# Patient Record
Sex: Male | Born: 1960
Health system: Southern US, Community
[De-identification: ages and names within clinical notes are randomized; demographics above are authoritative.]

## PROBLEM LIST (undated history)

## (undated) DIAGNOSIS — K219 Gastro-esophageal reflux disease without esophagitis: Secondary | ICD-10-CM

## (undated) HISTORY — DX: Gastro-esophageal reflux disease without esophagitis: K21.9

---

## 2001-06-12 ENCOUNTER — Ambulatory Visit (HOSPITAL_COMMUNITY): Admission: RE | Admit: 2001-06-12 | Discharge: 2001-06-12 | Payer: Self-pay | Admitting: Family Medicine

## 2001-06-12 ENCOUNTER — Encounter: Payer: Self-pay | Admitting: Family Medicine

## 2003-05-30 ENCOUNTER — Encounter: Admission: RE | Admit: 2003-05-30 | Discharge: 2003-05-30 | Payer: Self-pay | Admitting: Family Medicine

## 2004-06-01 ENCOUNTER — Ambulatory Visit: Payer: Self-pay | Admitting: Family Medicine

## 2004-11-20 ENCOUNTER — Ambulatory Visit: Payer: Self-pay | Admitting: Family Medicine

## 2005-12-15 ENCOUNTER — Ambulatory Visit: Payer: Self-pay | Admitting: Family Medicine

## 2006-06-05 ENCOUNTER — Encounter: Admission: RE | Admit: 2006-06-05 | Discharge: 2006-06-05 | Payer: Self-pay | Admitting: Family Medicine

## 2006-06-13 ENCOUNTER — Encounter: Admission: RE | Admit: 2006-06-13 | Discharge: 2006-06-13 | Payer: Self-pay | Admitting: Family Medicine

## 2006-06-17 ENCOUNTER — Encounter: Admission: RE | Admit: 2006-06-17 | Discharge: 2006-06-17 | Payer: Self-pay | Admitting: Psychiatry

## 2007-01-30 ENCOUNTER — Ambulatory Visit: Payer: Self-pay | Admitting: Family Medicine

## 2007-01-30 DIAGNOSIS — Z8719 Personal history of other diseases of the digestive system: Secondary | ICD-10-CM | POA: Insufficient documentation

## 2007-02-06 ENCOUNTER — Encounter: Payer: Self-pay | Admitting: Family Medicine

## 2007-02-23 ENCOUNTER — Encounter: Payer: Self-pay | Admitting: Family Medicine

## 2007-03-31 ENCOUNTER — Encounter: Payer: Self-pay | Admitting: Family Medicine

## 2007-04-07 ENCOUNTER — Encounter: Payer: Self-pay | Admitting: Family Medicine

## 2007-05-06 DIAGNOSIS — K2270 Barrett's esophagus without dysplasia: Secondary | ICD-10-CM | POA: Insufficient documentation

## 2008-03-28 ENCOUNTER — Encounter: Payer: Self-pay | Admitting: Family Medicine

## 2009-01-25 ENCOUNTER — Emergency Department (HOSPITAL_COMMUNITY): Admission: EM | Admit: 2009-01-25 | Discharge: 2009-01-25 | Payer: Self-pay | Admitting: Emergency Medicine

## 2009-01-27 ENCOUNTER — Ambulatory Visit: Payer: Self-pay | Admitting: Psychiatry

## 2009-01-27 ENCOUNTER — Other Ambulatory Visit (HOSPITAL_COMMUNITY): Admission: RE | Admit: 2009-01-27 | Discharge: 2009-03-20 | Payer: Self-pay | Admitting: Psychiatry

## 2009-02-03 ENCOUNTER — Ambulatory Visit: Payer: Self-pay | Admitting: Family Medicine

## 2010-07-22 LAB — URINE DRUGS OF ABUSE SCREEN W ALC, ROUTINE (REF LAB)
Amphetamine Screen, Ur: NEGATIVE
Amphetamine Screen, Ur: NEGATIVE
Barbiturate Quant, Ur: NEGATIVE
Barbiturate Quant, Ur: NEGATIVE
Benzodiazepines.: NEGATIVE
Benzodiazepines.: NEGATIVE
Cocaine Metabolites: NEGATIVE
Cocaine Metabolites: NEGATIVE
Creatinine,U: 26.3 mg/dL
Creatinine,U: 14.9 mg/dL
Ethyl Alcohol: <10 mg/dL (ref <10)
Ethyl Alcohol: <10 mg/dL (ref <10)
Marijuana Metabolite: NEGATIVE
Marijuana Metabolite: NEGATIVE
Methadone: NEGATIVE
Methadone: NEGATIVE
Opiate Screen, Urine: NEGATIVE
Opiate Screen, Urine: NEGATIVE
Phencyclidine (PCP): NEGATIVE
Phencyclidine (PCP): NEGATIVE
Propoxyphene: NEGATIVE
Propoxyphene: NEGATIVE

## 2010-07-23 LAB — DIFFERENTIAL
Basophils Absolute: 0 10*3/uL (ref 0.0–0.1)
Basophils Relative: 0 % (ref 0–1)
Eosinophils Absolute: 0 10*3/uL (ref 0.0–0.7)
Eosinophils Relative: 0 % (ref 0–5)
Lymphocytes Relative: 22 % (ref 12–46)
Lymphs Abs: 2.4 10*3/uL (ref 0.7–4.0)
Monocytes Absolute: 0.9 10*3/uL (ref 0.1–1.0)
Monocytes Relative: 9 % (ref 3–12)
Neutro Abs: 7.6 10*3/uL (ref 1.7–7.7)
Neutrophils Relative %: 69 % (ref 43–77)

## 2010-07-23 LAB — URINE DRUGS OF ABUSE SCREEN W ALC, ROUTINE (REF LAB)
Amphetamine Screen, Ur: NEGATIVE
Amphetamine Screen, Ur: NEGATIVE
Amphetamine Screen, Ur: NEGATIVE
Barbiturate Quant, Ur: NEGATIVE
Barbiturate Quant, Ur: NEGATIVE
Barbiturate Quant, Ur: NEGATIVE
Benzodiazepines.: NEGATIVE
Benzodiazepines.: NEGATIVE
Benzodiazepines.: NEGATIVE
Cocaine Metabolites: NEGATIVE
Cocaine Metabolites: NEGATIVE
Cocaine Metabolites: NEGATIVE
Creatinine,U: 24.4 mg/dL
Creatinine,U: 25.7 mg/dL
Creatinine,U: 43.4 mg/dL
Ethyl Alcohol: <10 mg/dL (ref <10)
Ethyl Alcohol: <10 mg/dL (ref <10)
Ethyl Alcohol: <10 mg/dL (ref <10)
Marijuana Metabolite: NEGATIVE
Marijuana Metabolite: NEGATIVE
Marijuana Metabolite: NEGATIVE
Methadone: NEGATIVE
Methadone: NEGATIVE
Methadone: NEGATIVE
Opiate Screen, Urine: NEGATIVE
Opiate Screen, Urine: NEGATIVE
Opiate Screen, Urine: NEGATIVE
Phencyclidine (PCP): NEGATIVE
Phencyclidine (PCP): NEGATIVE
Phencyclidine (PCP): NEGATIVE
Propoxyphene: NEGATIVE
Propoxyphene: NEGATIVE
Propoxyphene: NEGATIVE

## 2010-07-23 LAB — COMPREHENSIVE METABOLIC PANEL
ALT: 135 U/L — ABNORMAL HIGH (ref 0–53)
AST: 306 U/L — ABNORMAL HIGH (ref 0–37)
Albumin: 4.7 g/dL (ref 3.5–5.2)
Alkaline Phosphatase: 75 U/L (ref 39–117)
BUN: 9 mg/dL (ref 6–23)
CO2: 22 mEq/L (ref 19–32)
Calcium: 9.6 mg/dL (ref 8.4–10.5)
Chloride: 96 mEq/L (ref 96–112)
Creatinine, Ser: 1.07 mg/dL (ref 0.4–1.5)
GFR calc Af Amer: >60 mL/min (ref >60)
GFR calc non Af Amer: >60 mL/min (ref >60)
Glucose, Bld: 163 mg/dL — ABNORMAL HIGH (ref 70–99)
Potassium: 3.6 mEq/L (ref 3.5–5.1)
Sodium: 138 mEq/L (ref 135–145)
Total Bilirubin: 2.2 mg/dL — ABNORMAL HIGH (ref 0.3–1.2)
Total Protein: 8.3 g/dL (ref 6.0–8.3)

## 2010-07-23 LAB — ETHANOL: Alcohol, Ethyl (B): 12 mg/dL — ABNORMAL HIGH (ref 0–10)

## 2010-07-23 LAB — URINE MICROSCOPIC-ADD ON

## 2010-07-23 LAB — CBC
HCT: 47.3 % (ref 39.0–52.0)
Hemoglobin: 16.3 g/dL (ref 13.0–17.0)
MCHC: 34.5 g/dL (ref 30.0–36.0)
MCV: 91.9 fL (ref 78.0–100.0)
Platelets: 232 10*3/uL (ref 150–400)
RBC: 5.15 MIL/uL (ref 4.22–5.81)
RDW: 12.5 % (ref 11.5–15.5)
WBC: 10.9 10*3/uL — ABNORMAL HIGH (ref 4.0–10.5)

## 2010-07-23 LAB — RAPID URINE DRUG SCREEN, HOSP PERFORMED
Amphetamines: NOT DETECTED
Barbiturates: NOT DETECTED
Benzodiazepines: NOT DETECTED
Cocaine: NOT DETECTED
Opiates: NOT DETECTED
Tetrahydrocannabinol: NOT DETECTED

## 2010-07-23 LAB — LIPASE, BLOOD: Lipase: 38 U/L (ref 11–59)

## 2010-07-23 LAB — URINALYSIS, ROUTINE W REFLEX MICROSCOPIC
Bilirubin Urine: NEGATIVE
Glucose, UA: >1000 mg/dL — AB
Hgb urine dipstick: NEGATIVE
Ketones, ur: 15 mg/dL — AB
Leukocytes, UA: NEGATIVE
Nitrite: NEGATIVE
Protein, ur: 30 mg/dL — AB
Specific Gravity, Urine: 1.046 — ABNORMAL HIGH (ref 1.005–1.030)
Urobilinogen, UA: 0.2 mg/dL (ref 0.0–1.0)
pH: 5 (ref 5.0–8.0)

## 2011-02-09 ENCOUNTER — Ambulatory Visit (INDEPENDENT_AMBULATORY_CARE_PROVIDER_SITE_OTHER): Payer: BC Managed Care – PPO | Admitting: *Deleted

## 2011-02-09 VITALS — Temp 98.6°F

## 2011-02-09 DIAGNOSIS — Z23 Encounter for immunization: Secondary | ICD-10-CM

## 2011-02-09 DIAGNOSIS — Z0000 Encounter for general adult medical examination without abnormal findings: Secondary | ICD-10-CM

## 2011-02-15 ENCOUNTER — Telehealth: Payer: Self-pay | Admitting: Family Medicine

## 2011-02-15 ENCOUNTER — Ambulatory Visit: Payer: BC Managed Care – PPO

## 2011-02-15 NOTE — Telephone Encounter (Signed)
Patient request a call with lab results before his 9:45am appointment on Oct.30,2012

## 2011-02-15 NOTE — Telephone Encounter (Signed)
msg left to call the office     KP 

## 2011-02-16 ENCOUNTER — Ambulatory Visit (INDEPENDENT_AMBULATORY_CARE_PROVIDER_SITE_OTHER): Payer: BC Managed Care – PPO | Admitting: *Deleted

## 2011-02-16 ENCOUNTER — Other Ambulatory Visit: Payer: Self-pay | Admitting: Family Medicine

## 2011-02-16 DIAGNOSIS — Z23 Encounter for immunization: Secondary | ICD-10-CM

## 2011-02-16 DIAGNOSIS — Z0000 Encounter for general adult medical examination without abnormal findings: Secondary | ICD-10-CM

## 2011-02-16 NOTE — Telephone Encounter (Signed)
Spoke with patient and he requested the results of his serum drug screen and his labs when he came in today or his TB skin test. Labs are not back, so I called [facility]. I was made aware that the drug screens are still pending as of right now and 3 red top tubes came in without an order so they have not  Processed the blood, I advised of the requested test and she stated she could not do 2 of the test and I advised we will take of it when patient comes in today.

## 2011-02-16 NOTE — Telephone Encounter (Signed)
Patient made aware at OV with [facility] and repeated labs    KP

## 2011-02-17 LAB — VARICELLA ZOSTER ANTIBODY, IGG: Varicella IgG: 4.05 {ISR} — ABNORMAL HIGH

## 2011-02-17 LAB — MUMPS ANTIBODY, IGG: Mumps IgG: 2.63 {ISR} — ABNORMAL HIGH

## 2011-02-17 LAB — RUBEOLA ANTIBODY IGG: Rubeola IgG: 3.91 {ISR} — ABNORMAL HIGH

## 2011-02-18 ENCOUNTER — Other Ambulatory Visit: Payer: BC Managed Care – PPO

## 2011-02-18 LAB — TB SKIN TEST
Induration: 0
TB Skin Test: NEGATIVE mm

## 2011-02-18 NOTE — Progress Notes (Signed)
Labs only

## 2011-02-20 LAB — RUBELLA SCREEN: Rubella: 132.3 IU/mL — ABNORMAL HIGH

## 2011-02-22 ENCOUNTER — Ambulatory Visit: Payer: BC Managed Care – PPO

## 2011-03-03 ENCOUNTER — Encounter: Payer: Self-pay | Admitting: Family Medicine

## 2012-02-02 ENCOUNTER — Ambulatory Visit (INDEPENDENT_AMBULATORY_CARE_PROVIDER_SITE_OTHER): Payer: BC Managed Care – PPO

## 2012-02-02 DIAGNOSIS — Z111 Encounter for screening for respiratory tuberculosis: Secondary | ICD-10-CM

## 2012-02-02 NOTE — Patient Instructions (Signed)
Patient aware to return on Friday for reading of PPD skin test

## 2012-02-04 LAB — TB SKIN TEST
Induration: 0 mm
TB Skin Test: NEGATIVE

## 2013-01-22 ENCOUNTER — Ambulatory Visit (INDEPENDENT_AMBULATORY_CARE_PROVIDER_SITE_OTHER): Payer: BC Managed Care – PPO | Admitting: *Deleted

## 2013-01-22 DIAGNOSIS — Z23 Encounter for immunization: Secondary | ICD-10-CM

## 2013-01-22 DIAGNOSIS — Z111 Encounter for screening for respiratory tuberculosis: Secondary | ICD-10-CM

## 2013-01-25 ENCOUNTER — Encounter: Payer: Self-pay | Admitting: *Deleted

## 2013-01-25 LAB — TB SKIN TEST: TB Skin Test: NEGATIVE

## 2013-04-24 ENCOUNTER — Encounter: Payer: Self-pay | Admitting: Family Medicine

## 2013-04-24 ENCOUNTER — Ambulatory Visit (INDEPENDENT_AMBULATORY_CARE_PROVIDER_SITE_OTHER): Payer: BC Managed Care – PPO | Admitting: Family Medicine

## 2013-04-24 VITALS — BP 108/70 | HR 67 | Temp 98.3°F | Wt 186.0 lb

## 2013-04-24 DIAGNOSIS — T753XXA Motion sickness, initial encounter: Secondary | ICD-10-CM

## 2013-04-24 DIAGNOSIS — N529 Male erectile dysfunction, unspecified: Secondary | ICD-10-CM | POA: Insufficient documentation

## 2013-04-24 DIAGNOSIS — J019 Acute sinusitis, unspecified: Secondary | ICD-10-CM

## 2013-04-24 MED ORDER — CEFUROXIME AXETIL 500 MG PO TABS: 500.0000 mg | ORAL_TABLET | Freq: Two times a day (BID) | ORAL | Status: AC

## 2013-04-24 MED ORDER — SCOPOLAMINE 1 MG/3DAYS TD PT72: 1.0000 | MEDICATED_PATCH | TRANSDERMAL | Status: DC

## 2013-04-24 MED ORDER — GUAIFENESIN-CODEINE 100-10 MG/5ML PO SYRP: ORAL_SOLUTION | ORAL | Status: DC

## 2013-04-24 MED ORDER — FLUTICASONE PROPIONATE 50 MCG/ACT NA SUSP: 2.0000 | Freq: Every day | NASAL | Status: DC

## 2013-04-24 MED ORDER — SILDENAFIL CITRATE 100 MG PO TABS: 50.0000 mg | ORAL_TABLET | Freq: Every day | ORAL | Status: DC | PRN

## 2013-04-24 NOTE — Progress Notes (Signed)
  Subjective:     Christopher Bell is a 53 y.o. male who presents for evaluation of sinus pain. Symptoms include: congestion, facial pain, nasal congestion and sinus pressure. Onset of symptoms was 2 weeks ago. Symptoms have been gradually worsening since that time. Past history is significant for no history of pneumonia or bronchitis. Patient is a non-smoker.  Pt is also going on a cruise and needs scopalamine patch. He is also requesting viagra.    The following portions of the patient's history were reviewed and updated as appropriate: allergies, current medications, past family history, past medical history, past social history, past surgical history and problem list.  Review of Systems Pertinent items are noted in HPI.   Objective:    BP 108/70  Pulse 67  Temp(Src) 98.3 F (36.8 C) (Oral)  Wt 186 lb (84.369 kg)  SpO2 98% General appearance: alert, cooperative, appears stated age and no distress Ears: normal TM's and external ear canals both ears Nose: green discharge, moderate congestion, turbinates red, swollen, sinus tenderness bilateral Throat: abnormal findings: mild oropharyngeal erythema and PND Neck: mild anterior cervical adenopathy, supple, symmetrical, trachea midline and thyroid not enlarged, symmetric, no tenderness/mass/nodules Lungs: clear to auscultation bilaterally Heart: S1, S2 normal    Assessment:    Acute bacterial sinusitis.    Plan:    Nasal steroids per medication orders. Antihistamines per medication orders. Ceftin per medication orders.

## 2013-04-24 NOTE — Assessment & Plan Note (Signed)
rx for patch given

## 2013-04-24 NOTE — Patient Instructions (Signed)

## 2013-04-24 NOTE — Assessment & Plan Note (Signed)
viagra 100 mg samples given to pt to try

## 2013-04-24 NOTE — Progress Notes (Signed)
Pre visit review using our clinic review tool, if applicable. No additional management support is needed unless otherwise documented below in the visit note. 

## 2013-06-12 ENCOUNTER — Telehealth: Payer: Self-pay | Admitting: *Deleted

## 2013-06-12 NOTE — Telephone Encounter (Signed)
Please advise      KP 

## 2013-06-12 NOTE — Telephone Encounter (Signed)
Patient called and wanted to see if we could prescribe something else for sildenafil (VIAGRA) 100 MG tablet since his insurance did not cover it.

## 2013-06-13 NOTE — Telephone Encounter (Signed)
Ins does not cover any--- I don't think.  He can check his formulary

## 2013-06-13 NOTE — Telephone Encounter (Signed)
Spoke with patient and he voiced understanding. He wanted a sample of the Cialis and Dr.Lowne said it was ok to leave at check in.       [place]

## 2013-07-17 ENCOUNTER — Telehealth: Payer: Self-pay | Admitting: Family Medicine

## 2013-07-17 DIAGNOSIS — K219 Gastro-esophageal reflux disease without esophagitis: Secondary | ICD-10-CM

## 2013-07-17 MED ORDER — PANTOPRAZOLE SODIUM 40 MG PO TBEC: 40.0000 mg | DELAYED_RELEASE_TABLET | Freq: Every day | ORAL | Status: DC

## 2013-07-17 NOTE — Telephone Encounter (Signed)
Refill for 90 day supply of pantoprazole sent to [facility], pt aware

## 2013-07-17 NOTE — Telephone Encounter (Signed)
Rx sent to [facility] for pantoprazole per pt request.

## 2013-07-17 NOTE — Telephone Encounter (Signed)
Patient called and stated that he needs a refill for Pantoprazole. Patient states that Dr Cristina Gong usually writes that for him but he needs an office visit and Dr Cristina Gong does not have any appointment until May. Dr Cristina Gong suggested that he calls [place] and see if Dr Etter Sjogren would refill it for him. Please advise.  Pharmacy [facility] [address]

## 2013-07-17 NOTE — Telephone Encounter (Signed)
Patient called and states that he  made a mistake with what pharmacy he would like his rx sent to. He meant [facility] say [facility]. Also he would like a call when its done. Please advise

## 2013-07-17 NOTE — Addendum Note (Signed)
Addended by: Chilton Greathouse on: 07/17/2013 01:49 PM   Modules accepted: Orders

## 2013-11-02 ENCOUNTER — Telehealth: Payer: Self-pay | Admitting: Family Medicine

## 2013-11-02 NOTE — Telephone Encounter (Signed)
Caller name:Crimson Relation to LV:[id number] Call back number:[phone] Pharmacy: [facility]   Reason for call: to request a refill for loperamide

## 2013-11-05 NOTE — Telephone Encounter (Signed)
Patient called a second time to check the status of his refill. Please advise.

## 2013-11-07 MED ORDER — DIPHENOXYLATE-ATROPINE 2.5-0.025 MG PO TABS: 1.0000 | ORAL_TABLET | Freq: Four times a day (QID) | ORAL | Status: DC | PRN

## 2013-11-07 NOTE — Telephone Encounter (Signed)
Spoke with patient, medication is Lomotil. Sent new Rx to [facility]

## 2013-11-08 MED ORDER — DIPHENOXYLATE-ATROPINE 2.5-0.025 MG PO TABS: 1.0000 | ORAL_TABLET | Freq: Four times a day (QID) | ORAL | Status: DC | PRN

## 2013-11-08 NOTE — Addendum Note (Signed)
Addended by: Reino Bellis on: 11/08/2013 12:36 PM   Modules accepted: Orders

## 2013-11-12 ENCOUNTER — Telehealth: Payer: Self-pay | Admitting: Family Medicine

## 2013-11-12 ENCOUNTER — Other Ambulatory Visit: Payer: Self-pay

## 2013-11-12 MED ORDER — LOPERAMIDE HCL 2 MG PO CAPS: 4.0000 mg | ORAL_CAPSULE | Freq: Three times a day (TID) | ORAL | Status: DC

## 2013-11-12 NOTE — Telephone Encounter (Signed)
Caller name: Kymere  Call back number: [phone] Pharmacy: [facility]  Reason for call:  [facility] diphenoxylate-atropine (LOMOTIL) 2.5-0.025 MG per.  Can we resend.

## 2013-11-12 NOTE — Telephone Encounter (Signed)
Done in another encounter.      KP

## 2013-11-12 NOTE — Telephone Encounter (Signed)
Wife came in with Rx to have refilled, she was told by Rachel Bo the Rx was sent. Note sent per chart. Rx sent as directed with 1 refill to [facility].      KP

## 2013-11-16 ENCOUNTER — Other Ambulatory Visit: Payer: Self-pay

## 2013-11-16 MED ORDER — LOPERAMIDE HCL 2 MG PO CAPS: 4.0000 mg | ORAL_CAPSULE | Freq: Three times a day (TID) | ORAL | Status: DC

## 2014-01-08 ENCOUNTER — Ambulatory Visit (INDEPENDENT_AMBULATORY_CARE_PROVIDER_SITE_OTHER): Payer: BC Managed Care – PPO

## 2014-01-08 DIAGNOSIS — IMO0001 Reserved for inherently not codable concepts without codable children: Secondary | ICD-10-CM

## 2014-01-08 DIAGNOSIS — A184 Tuberculosis of skin and subcutaneous tissue: Secondary | ICD-10-CM

## 2014-01-10 ENCOUNTER — Ambulatory Visit (INDEPENDENT_AMBULATORY_CARE_PROVIDER_SITE_OTHER): Payer: BC Managed Care – PPO

## 2014-01-10 DIAGNOSIS — Z23 Encounter for immunization: Secondary | ICD-10-CM

## 2014-01-10 LAB — TB SKIN TEST
Induration: 0 mm
TB Skin Test: NEGATIVE

## 2014-06-17 ENCOUNTER — Other Ambulatory Visit: Payer: Self-pay | Admitting: Family Medicine

## 2014-10-11 ENCOUNTER — Telehealth: Payer: Self-pay | Admitting: Family Medicine

## 2014-10-11 ENCOUNTER — Ambulatory Visit: Payer: Self-pay | Admitting: Medical

## 2014-10-11 MED ORDER — LOPERAMIDE HCL 2 MG PO CAPS: 4.0000 mg | ORAL_CAPSULE | Freq: Three times a day (TID) | ORAL | Status: DC

## 2014-10-11 MED ORDER — PANTOPRAZOLE SODIUM 40 MG PO TBEC: 40.0000 mg | DELAYED_RELEASE_TABLET | Freq: Every day | ORAL | Status: DC

## 2014-10-11 NOTE — Telephone Encounter (Signed)
Rx faxed.    KP 

## 2014-10-11 NOTE — Telephone Encounter (Signed)
Relation to pt: self  Call back number: [phone] Pharmacy:[facility]   Reason for call:   pt requesting a 90 day supply due to loperamide (IMODIUM) 2 MG capsule and pantoprazole (PROTONIX) 40 MG tablet. Pt scheduled appointment for  11/01/2014.

## 2014-10-18 MED ORDER — LOPERAMIDE HCL 2 MG PO CAPS: 4.0000 mg | ORAL_CAPSULE | Freq: Three times a day (TID) | ORAL | Status: DC

## 2014-10-18 NOTE — Addendum Note (Signed)
Addended by: Ewing Schlein on: 10/18/2014 02:34 PM   Modules accepted: Orders

## 2014-10-18 NOTE — Telephone Encounter (Signed)
Patient states that the incorrect amt of loperamide was [facility]. Requesting a callback from [place] before this is resubmitted.

## 2014-10-18 NOTE — Telephone Encounter (Addendum)
Spoke with the patient and his quantity should be 540. I advised I would update his chart.     KP

## 2014-10-18 NOTE — Telephone Encounter (Signed)
MSG left to call the office      KP 

## 2014-11-01 ENCOUNTER — Ambulatory Visit: Payer: Self-pay | Admitting: Family Medicine

## 2014-11-01 ENCOUNTER — Encounter: Payer: Self-pay | Admitting: Behavioral Health

## 2014-11-01 ENCOUNTER — Telehealth: Payer: Self-pay | Admitting: Behavioral Health

## 2014-11-01 NOTE — Telephone Encounter (Signed)
Pre-Visit Call completed with patient and chart updated.   Pre-Visit Info documented in Specialty Comments under [employer].    

## 2014-11-04 ENCOUNTER — Ambulatory Visit (INDEPENDENT_AMBULATORY_CARE_PROVIDER_SITE_OTHER): Payer: BLUE CROSS/BLUE SHIELD | Admitting: Family Medicine

## 2014-11-04 ENCOUNTER — Encounter: Payer: Self-pay | Admitting: Family Medicine

## 2014-11-04 VITALS — BP 108/64 | HR 56 | Temp 98.0°F | Ht 70.0 in | Wt 178.0 lb

## 2014-11-04 DIAGNOSIS — N401 Enlarged prostate with lower urinary tract symptoms: Secondary | ICD-10-CM

## 2014-11-04 DIAGNOSIS — D229 Melanocytic nevi, unspecified: Secondary | ICD-10-CM | POA: Diagnosis not present

## 2014-11-04 DIAGNOSIS — K2270 Barrett's esophagus without dysplasia: Secondary | ICD-10-CM

## 2014-11-04 DIAGNOSIS — Z0000 Encounter for general adult medical examination without abnormal findings: Secondary | ICD-10-CM | POA: Diagnosis not present

## 2014-11-04 DIAGNOSIS — R351 Nocturia: Secondary | ICD-10-CM

## 2014-11-04 DIAGNOSIS — R7989 Other specified abnormal findings of blood chemistry: Secondary | ICD-10-CM | POA: Diagnosis not present

## 2014-11-04 DIAGNOSIS — N528 Other male erectile dysfunction: Secondary | ICD-10-CM

## 2014-11-04 DIAGNOSIS — K219 Gastro-esophageal reflux disease without esophagitis: Secondary | ICD-10-CM

## 2014-11-04 LAB — LIPID PANEL
Cholesterol: 226 mg/dL — ABNORMAL HIGH (ref 0–200)
HDL: 47.4 mg/dL (ref >39.00)
NonHDL: 178.6
Total CHOL/HDL Ratio: 5
Triglycerides: 215 mg/dL — ABNORMAL HIGH (ref 0.0–149.0)
VLDL: 43 mg/dL — ABNORMAL HIGH (ref 0.0–40.0)

## 2014-11-04 LAB — CBC WITH DIFFERENTIAL/PLATELET
Basophils Absolute: 0 10*3/uL (ref 0.0–0.1)
Basophils Relative: 0.5 % (ref 0.0–3.0)
Eosinophils Absolute: 0.1 10*3/uL (ref 0.0–0.7)
Eosinophils Relative: 1.4 % (ref 0.0–5.0)
HCT: 41 % (ref 39.0–52.0)
Hemoglobin: 13.9 g/dL (ref 13.0–17.0)
Lymphocytes Relative: 28.1 % (ref 12.0–46.0)
Lymphs Abs: 1.7 10*3/uL (ref 0.7–4.0)
MCHC: 33.8 g/dL (ref 30.0–36.0)
MCV: 88.3 fl (ref 78.0–100.0)
Monocytes Absolute: 0.4 10*3/uL (ref 0.1–1.0)
Monocytes Relative: 6 % (ref 3.0–12.0)
Neutro Abs: 3.8 10*3/uL (ref 1.4–7.7)
Neutrophils Relative %: 64 % (ref 43.0–77.0)
Platelets: 264 10*3/uL (ref 150.0–400.0)
RBC: 4.64 Mil/uL (ref 4.22–5.81)
RDW: 12.4 % (ref 11.5–15.5)
WBC: 6 10*3/uL (ref 4.0–10.5)

## 2014-11-04 LAB — BASIC METABOLIC PANEL
BUN: 11 mg/dL (ref 6–23)
CO2: 30 mEq/L (ref 19–32)
Calcium: 9.3 mg/dL (ref 8.4–10.5)
Chloride: 101 mEq/L (ref 96–112)
Creatinine, Ser: 0.92 mg/dL (ref 0.40–1.50)
GFR: 91.37 mL/min (ref >60.00)
Glucose, Bld: 88 mg/dL (ref 70–99)
Potassium: 4.2 mEq/L (ref 3.5–5.1)
Sodium: 140 mEq/L (ref 135–145)

## 2014-11-04 LAB — HEPATIC FUNCTION PANEL
ALT: 13 U/L (ref 0–53)
AST: 18 U/L (ref 0–37)
Albumin: 4.5 g/dL (ref 3.5–5.2)
Alkaline Phosphatase: 60 U/L (ref 39–117)
Bilirubin, Direct: 0.1 mg/dL (ref 0.0–0.3)
Total Bilirubin: 0.5 mg/dL (ref 0.2–1.2)
Total Protein: 7.3 g/dL (ref 6.0–8.3)

## 2014-11-04 LAB — LDL CHOLESTEROL, DIRECT: Direct LDL: 145 mg/dL

## 2014-11-04 LAB — POCT URINALYSIS DIPSTICK
Bilirubin, UA: NEGATIVE
Blood, UA: NEGATIVE
Glucose, UA: NEGATIVE
Ketones, UA: NEGATIVE
Leukocytes, UA: NEGATIVE
Nitrite, UA: NEGATIVE
Protein, UA: NEGATIVE
Spec Grav, UA: 1.025
Urobilinogen, UA: 0.2
pH, UA: 6

## 2014-11-04 LAB — HIV ANTIBODY (ROUTINE TESTING W REFLEX): HIV 1&2 Ab, 4th Generation: NONREACTIVE

## 2014-11-04 LAB — PSA: PSA: 0.54 ng/mL (ref 0.10–4.00)

## 2014-11-04 LAB — TSH: TSH: 1.51 u[IU]/mL (ref 0.35–4.50)

## 2014-11-04 MED ORDER — TAMSULOSIN HCL 0.4 MG PO CAPS: 0.4000 mg | ORAL_CAPSULE | Freq: Every day | ORAL | Status: DC

## 2014-11-04 MED ORDER — SILDENAFIL CITRATE 25 MG PO TABS
25.0000 mg | ORAL_TABLET | Freq: Every day | ORAL | Status: DC | PRN
Start: 2014-11-04 — End: 2014-11-11

## 2014-11-04 MED ORDER — NIZATIDINE 75 MG PO TABS: 75.0000 mg | ORAL_TABLET | Freq: Every day | ORAL | Status: DC

## 2014-11-04 NOTE — Progress Notes (Signed)
Pre visit review using our clinic review tool, if applicable. No additional management support is needed unless otherwise documented below in the visit note. 

## 2014-11-04 NOTE — Patient Instructions (Signed)
Preventive Care for Adults A healthy lifestyle and preventive care can promote health and wellness. Preventive health guidelines for men include the following key practices:  A routine yearly physical is a good way to check with your health care provider about your health and preventative screening. It is a chance to share any concerns and updates on your health and to receive a thorough exam.  Visit your dentist for a routine exam and preventative care every 6 months. Brush your teeth twice a day and floss once a day. Good oral hygiene prevents tooth decay and gum disease.  The frequency of eye exams is based on your age, health, family medical history, use of contact lenses, and other factors. Follow your health care provider's recommendations for frequency of eye exams.  Eat a healthy diet. Foods such as vegetables, fruits, whole grains, low-fat dairy products, and lean protein foods contain the nutrients you need without too many calories. Decrease your intake of foods high in solid fats, added sugars, and salt. Eat the right amount of calories for you.Get information about a proper diet from your health care provider, if necessary.  Regular physical exercise is one of the most important things you can do for your health. Most adults should get at least 150 minutes of moderate-intensity exercise (any activity that increases your heart rate and causes you to sweat) each week. In addition, most adults need muscle-strengthening exercises on 2 or more days a week.  Maintain a healthy weight. The body mass index (BMI) is a screening tool to identify possible weight problems. It provides an estimate of body fat based on height and weight. Your health care provider can find your BMI and can help you achieve or maintain a healthy weight.For adults 20 years and older:  A BMI below 18.5 is considered underweight.  A BMI of 18.5 to 24.9 is normal.  A BMI of 25 to 29.9 is considered overweight.  A BMI  of 30 and above is considered obese.  Maintain normal blood lipids and cholesterol levels by exercising and minimizing your intake of saturated fat. Eat a balanced diet with plenty of fruit and vegetables. Blood tests for lipids and cholesterol should begin at age 50 and be repeated every 5 years. If your lipid or cholesterol levels are high, you are over 50, or you are at high risk for heart disease, you may need your cholesterol levels checked more frequently.Ongoing high lipid and cholesterol levels should be treated with medicines if diet and exercise are not working.  If you smoke, find out from your health care provider how to quit. If you do not use tobacco, do not start.  Lung cancer screening is recommended for adults aged 73-80 years who are at high risk for developing lung cancer because of a history of smoking. A yearly low-dose CT scan of the lungs is recommended for people who have at least a 30-pack-year history of smoking and are a current smoker or have quit within the past 15 years. A pack year of smoking is smoking an average of 1 pack of cigarettes a day for 1 year (for example: 1 pack a day for 30 years or 2 packs a day for 15 years). Yearly screening should continue until the smoker has stopped smoking for at least 15 years. Yearly screening should be stopped for people who develop a health problem that would prevent them from having lung cancer treatment.  If you choose to drink alcohol, do not have more than  2 drinks per day. One drink is considered to be 12 ounces (355 mL) of beer, 5 ounces (148 mL) of wine, or 1.5 ounces (44 mL) of liquor.  Avoid use of street drugs. Do not share needles with anyone. Ask for help if you need support or instructions about stopping the use of drugs.  High blood pressure causes heart disease and increases the risk of stroke. Your blood pressure should be checked at least every 1-2 years. Ongoing high blood pressure should be treated with  medicines, if weight loss and exercise are not effective.  If you are 37-19 years old, ask your health care provider if you should take aspirin to prevent heart disease.  Diabetes screening involves taking a blood sample to check your fasting blood sugar level. This should be done once every 3 years, after age 68, if you are within normal weight and without risk factors for diabetes. Testing should be considered at a younger age or be carried out more frequently if you are overweight and have at least 1 risk factor for diabetes.  Colorectal cancer can be detected and often prevented. Most routine colorectal cancer screening begins at the age of 68 and continues through age 8. However, your health care provider may recommend screening at an earlier age if you have risk factors for colon cancer. On a yearly basis, your health care provider may provide home test kits to check for hidden blood in the stool. Use of a small camera at the end of a tube to directly examine the colon (sigmoidoscopy or colonoscopy) can detect the earliest forms of colorectal cancer. Talk to your health care provider about this at age 42, when routine screening begins. Direct exam of the colon should be repeated every 5-10 years through age 27, unless early forms of precancerous polyps or small growths are found.  People who are at an increased risk for hepatitis B should be screened for this virus. You are considered at high risk for hepatitis B if:  You were born in a country where hepatitis B occurs often. Talk with your health care provider about which countries are considered high risk.  Your parents were born in a high-risk country and you have not received a shot to protect against hepatitis B (hepatitis B vaccine).  You have HIV or AIDS.  You use needles to inject street drugs.  You live with, or have sex with, someone who has hepatitis B.  You are a man who has sex with other men (MSM).  You get hemodialysis  treatment.  You take certain medicines for conditions such as cancer, organ transplantation, and autoimmune conditions.  Hepatitis C blood testing is recommended for all people born from [birth year] through 1965 and any individual with known risks for hepatitis C.  Practice safe sex. Use condoms and avoid high-risk sexual practices to reduce the spread of sexually transmitted infections (STIs). STIs include gonorrhea, chlamydia, syphilis, trichomonas, herpes, HPV, and human immunodeficiency virus (HIV). Herpes, HIV, and HPV are viral illnesses that have no cure. They can result in disability, cancer, and death.  If you are at risk of being infected with HIV, it is recommended that you take a prescription medicine daily to prevent HIV infection. This is called preexposure prophylaxis (PrEP). You are considered at risk if:  You are a man who has sex with other men (MSM) and have other risk factors.  You are a heterosexual man, are sexually active, and are at increased risk for HIV infection.  You take drugs by injection.  You are sexually active with a partner who has HIV.  Talk with your health care provider about whether you are at high risk of being infected with HIV. If you choose to begin PrEP, you should first be tested for HIV. You should then be tested every 3 months for as long as you are taking PrEP.  A one-time screening for abdominal aortic aneurysm (AAA) and surgical repair of large AAAs by ultrasound are recommended for men ages 32 to 67 years who are current or former smokers.  Healthy men should no longer receive prostate-specific antigen (PSA) blood tests as part of routine cancer screening. Talk with your health care provider about prostate cancer screening.  Testicular cancer screening is not recommended for adult males who have no symptoms. Screening includes self-exam, a health care provider exam, and other screening tests. Consult with your health care provider about any symptoms  you have or any concerns you have about testicular cancer.  Use sunscreen. Apply sunscreen liberally and repeatedly throughout the day. You should seek shade when your shadow is shorter than you. Protect yourself by wearing long sleeves, pants, a wide-brimmed hat, and sunglasses year round, whenever you are outdoors.  Once a month, do a whole-body skin exam, using a mirror to look at the skin on your back. Tell your health care provider about new moles, moles that have irregular borders, moles that are larger than a pencil eraser, or moles that have changed in shape or color.  Stay current with required vaccines (immunizations).  Influenza vaccine. All adults should be immunized every year.  Tetanus, diphtheria, and acellular pertussis (Td, Tdap) vaccine. An adult who has not previously received Tdap or who does not know his vaccine status should receive 1 dose of Tdap. This initial dose should be followed by tetanus and diphtheria toxoids (Td) booster doses every 10 years. Adults with an unknown or incomplete history of completing a 3-dose immunization series with Td-containing vaccines should begin or complete a primary immunization series including a Tdap dose. Adults should receive a Td booster every 10 years.  Varicella vaccine. An adult without evidence of immunity to varicella should receive 2 doses or a second dose if he has previously received 1 dose.  Human papillomavirus (HPV) vaccine. Males aged 68-21 years who have not received the vaccine previously should receive the 3-dose series. Males aged 22-26 years may be immunized. Immunization is recommended through the age of 6 years for any male who has sex with males and did not get any or all doses earlier. Immunization is recommended for any person with an immunocompromised condition through the age of 49 years if he did not get any or all doses earlier. During the 3-dose series, the second dose should be obtained 4-8 weeks after the first  dose. The third dose should be obtained 24 weeks after the first dose and 16 weeks after the second dose.  Zoster vaccine. One dose is recommended for adults aged 50 years or older unless certain conditions are present.  Measles, mumps, and rubella (MMR) vaccine. Adults born before [birth year] generally are considered immune to measles and mumps. Adults born in [birth year] or later should have 1 or more doses of MMR vaccine unless there is a contraindication to the vaccine or there is laboratory evidence of immunity to each of the three diseases. A routine second dose of MMR vaccine should be obtained at least 28 days after the first dose for students attending postsecondary  schools, health care workers, or international travelers. People who received inactivated measles vaccine or an unknown type of measles vaccine during 1963-1967 should receive 2 doses of MMR vaccine. People who received inactivated mumps vaccine or an unknown type of mumps vaccine before 1979 and are at high risk for mumps infection should consider immunization with 2 doses of MMR vaccine. Unvaccinated health care workers born before 1957 who lack laboratory evidence of measles, mumps, or rubella immunity or laboratory confirmation of disease should consider measles and mumps immunization with 2 doses of MMR vaccine or rubella immunization with 1 dose of MMR vaccine.  Pneumococcal 13-valent conjugate (PCV13) vaccine. When indicated, a person who is uncertain of his immunization history and has no record of immunization should receive the PCV13 vaccine. An adult aged 19 years or older who has certain medical conditions and has not been previously immunized should receive 1 dose of PCV13 vaccine. This PCV13 should be followed with a dose of pneumococcal polysaccharide (PPSV23) vaccine. The PPSV23 vaccine dose should be obtained at least 8 weeks after the dose of PCV13 vaccine. An adult aged 19 years or older who has certain medical conditions and  previously received 1 or more doses of PPSV23 vaccine should receive 1 dose of PCV13. The PCV13 vaccine dose should be obtained 1 or more years after the last PPSV23 vaccine dose.  Pneumococcal polysaccharide (PPSV23) vaccine. When PCV13 is also indicated, PCV13 should be obtained first. All adults aged 65 years and older should be immunized. An adult younger than age 65 years who has certain medical conditions should be immunized. Any person who resides in a nursing home or long-term care facility should be immunized. An adult smoker should be immunized. People with an immunocompromised condition and certain other conditions should receive both PCV13 and PPSV23 vaccines. People with human immunodeficiency virus (HIV) infection should be immunized as soon as possible after diagnosis. Immunization during chemotherapy or radiation therapy should be avoided. Routine use of PPSV23 vaccine is not recommended for American Indians, [place] Natives, or people younger than 65 years unless there are medical conditions that require PPSV23 vaccine. When indicated, people who have unknown immunization and have no record of immunization should receive PPSV23 vaccine. One-time revaccination 5 years after the first dose of PPSV23 is recommended for people aged 19-64 years who have chronic kidney failure, nephrotic syndrome, asplenia, or immunocompromised conditions. People who received 1-2 doses of PPSV23 before age 65 years should receive another dose of PPSV23 vaccine at age 65 years or later if at least 5 years have passed since the previous dose. Doses of PPSV23 are not needed for people immunized with PPSV23 at or after age 65 years.  Meningococcal vaccine. Adults with asplenia or persistent complement component deficiencies should receive 2 doses of quadrivalent meningococcal conjugate (MenACWY-D) vaccine. The doses should be obtained at least 2 months apart. Microbiologists working with certain meningococcal bacteria,  military recruits, people at risk during an outbreak, and people who travel to or live in countries with a high rate of meningitis should be immunized. A first-year college student up through age 21 years who is living in a residence hall should receive a dose if he did not receive a dose on or after his 16th birthday. Adults who have certain high-risk conditions should receive one or more doses of vaccine.  Hepatitis A vaccine. Adults who wish to be protected from this disease, have certain high-risk conditions, work with hepatitis A-infected animals, work in hepatitis A research labs, or   travel to or work in countries with a high rate of hepatitis A should be immunized. Adults who were previously unvaccinated and who anticipate close contact with an international adoptee during the first 60 days after arrival in the [place] from a country with a high rate of hepatitis A should be immunized.  Hepatitis B vaccine. Adults should be immunized if they wish to be protected from this disease, have certain high-risk conditions, may be exposed to blood or other infectious body fluids, are household contacts or sex partners of hepatitis B positive people, are clients or workers in certain care facilities, or travel to or work in countries with a high rate of hepatitis B.  Haemophilus influenzae type b (Hib) vaccine. A previously unvaccinated person with asplenia or sickle cell disease or having a scheduled splenectomy should receive 1 dose of Hib vaccine. Regardless of previous immunization, a recipient of a hematopoietic stem cell transplant should receive a 3-dose series 6-12 months after his successful transplant. Hib vaccine is not recommended for adults with HIV infection. Preventive Service / Frequency Ages 52 to 17  Blood pressure check.** / Every 1 to 2 years.  Lipid and cholesterol check.** / Every 5 years beginning at age 69.  Hepatitis C blood test.** / For any individual with known risks for  hepatitis C.  Skin self-exam. / Monthly.  Influenza vaccine. / Every year.  Tetanus, diphtheria, and acellular pertussis (Tdap, Td) vaccine.** / Consult your health care provider. 1 dose of Td every 10 years.  Varicella vaccine.** / Consult your health care provider.  HPV vaccine. / 3 doses over 6 months, if 72 or younger.  Measles, mumps, rubella (MMR) vaccine.** / You need at least 1 dose of MMR if you were born in 1957 or later. You may also need a second dose.  Pneumococcal 13-valent conjugate (PCV13) vaccine.** / Consult your health care provider.  Pneumococcal polysaccharide (PPSV23) vaccine.** / 1 to 2 doses if you smoke cigarettes or if you have certain conditions.  Meningococcal vaccine.** / 1 dose if you are age 35 to 60 years and a Market researcher living in a residence hall, or have one of several medical conditions. You may also need additional booster doses.  Hepatitis A vaccine.** / Consult your health care provider.  Hepatitis B vaccine.** / Consult your health care provider.  Haemophilus influenzae type b (Hib) vaccine.** / Consult your health care provider. Ages 35 to 8  Blood pressure check.** / Every 1 to 2 years.  Lipid and cholesterol check.** / Every 5 years beginning at age 57.  Lung cancer screening. / Every year if you are aged 44-80 years and have a 30-pack-year history of smoking and currently smoke or have quit within the past 15 years. Yearly screening is stopped once you have quit smoking for at least 15 years or develop a health problem that would prevent you from having lung cancer treatment.  Fecal occult blood test (FOBT) of stool. / Every year beginning at age 55 and continuing until age 73. You may not have to do this test if you get a colonoscopy every 10 years.  Flexible sigmoidoscopy** or colonoscopy.** / Every 5 years for a flexible sigmoidoscopy or every 10 years for a colonoscopy beginning at age 28 and continuing until age  1.  Hepatitis C blood test.** / For all people born from [birth year] through 1965 and any individual with known risks for hepatitis C.  Skin self-exam. / Monthly.  Influenza vaccine. / Every  year.  Tetanus, diphtheria, and acellular pertussis (Tdap/Td) vaccine.** / Consult your health care provider. 1 dose of Td every 10 years.  Varicella vaccine.** / Consult your health care provider.  Zoster vaccine.** / 1 dose for adults aged 53 years or older.  Measles, mumps, rubella (MMR) vaccine.** / You need at least 1 dose of MMR if you were born in 1957 or later. You may also need a second dose.  Pneumococcal 13-valent conjugate (PCV13) vaccine.** / Consult your health care provider.  Pneumococcal polysaccharide (PPSV23) vaccine.** / 1 to 2 doses if you smoke cigarettes or if you have certain conditions.  Meningococcal vaccine.** / Consult your health care provider.  Hepatitis A vaccine.** / Consult your health care provider.  Hepatitis B vaccine.** / Consult your health care provider.  Haemophilus influenzae type b (Hib) vaccine.** / Consult your health care provider. Ages 77 and over  Blood pressure check.** / Every 1 to 2 years.  Lipid and cholesterol check.**/ Every 5 years beginning at age 85.  Lung cancer screening. / Every year if you are aged 55-80 years and have a 30-pack-year history of smoking and currently smoke or have quit within the past 15 years. Yearly screening is stopped once you have quit smoking for at least 15 years or develop a health problem that would prevent you from having lung cancer treatment.  Fecal occult blood test (FOBT) of stool. / Every year beginning at age 33 and continuing until age 11. You may not have to do this test if you get a colonoscopy every 10 years.  Flexible sigmoidoscopy** or colonoscopy.** / Every 5 years for a flexible sigmoidoscopy or every 10 years for a colonoscopy beginning at age 28 and continuing until age 73.  Hepatitis C blood  test.** / For all people born from [birth year] through 1964 and any individual with known risks for hepatitis C.  Abdominal aortic aneurysm (AAA) screening.** / A one-time screening for ages 50 to 27 years who are current or former smokers.  Skin self-exam. / Monthly.  Influenza vaccine. / Every year.  Tetanus, diphtheria, and acellular pertussis (Tdap/Td) vaccine.** / 1 dose of Td every 10 years.  Varicella vaccine.** / Consult your health care provider.  Zoster vaccine.** / 1 dose for adults aged 34 years or older.  Pneumococcal 13-valent conjugate (PCV13) vaccine.** / Consult your health care provider.  Pneumococcal polysaccharide (PPSV23) vaccine.** / 1 dose for all adults aged 63 years and older.  Meningococcal vaccine.** / Consult your health care provider.  Hepatitis A vaccine.** / Consult your health care provider.  Hepatitis B vaccine.** / Consult your health care provider.  Haemophilus influenzae type b (Hib) vaccine.** / Consult your health care provider. **Family history and personal history of risk and conditions may change your health care provider's recommendations. Document Released: 06/01/2001 Document Revised: 04/10/2013 Document Reviewed: 08/31/2010 [facility] Patient Information 2015 [place], [place]. This information is not intended to replace advice given to you by your health care provider. Make sure you discuss any questions you have with your health care provider.

## 2014-11-04 NOTE — Assessment & Plan Note (Signed)
con't axid per Dr Jackquline Denmark

## 2014-11-04 NOTE — Assessment & Plan Note (Signed)
Pt requesting generic viagra

## 2014-11-04 NOTE — Progress Notes (Signed)
Patient ID: Christopher Bell, male    DOB: [birth year]  Age: 54 y.o. MRN: [id number]    Subjective:  Subjective HPI Christopher Bell presents for cpe and c/o weak urine stream x 2 years -- he also c/o strangel growth on L side of face x several months.  Not discolored.  No pain, no itching.  No hx insect bite.   He would like to see about having them removed.  Review of Systems  Constitutional: Negative for fatigue and unexpected weight change.  Respiratory: Negative for cough and shortness of breath.   Cardiovascular: Negative for chest pain and palpitations.  Neurological: Negative for dizziness, tremors, seizures, syncope, speech difficulty, weakness, light-headedness, numbness and headaches.  Hematological: Negative for adenopathy. Does not bruise/bleed easily.  Psychiatric/Behavioral: Negative for decreased concentration. The patient is not nervous/anxious.     History Past Medical History  Diagnosis Date  . GERD (gastroesophageal reflux disease)     He has no past surgical history on file.   His family history is not on file.He reports that he has never smoked. He has never used smokeless tobacco. He reports that he does not drink alcohol or use illicit drugs.  Current Outpatient Prescriptions on File Prior to Visit  Medication Sig Dispense Refill  . loperamide (IMODIUM) 2 MG capsule Take 2 capsules (4 mg total) by mouth 3 (three) times daily. 540 capsule 0   No current facility-administered medications on file prior to visit.     Objective:  Objective Physical Exam  Constitutional: He is oriented to person, place, and time. He appears well-developed and well-nourished. No distress.  HENT:  Head: Normocephalic and atraumatic.  Right Ear: External ear normal.  Left Ear: External ear normal.  Nose: Nose normal.  Mouth/Throat: Oropharynx is clear and moist. No oropharyngeal exudate.  Eyes: Conjunctivae and EOM are normal. Pupils are equal, round, and reactive to light.  Right eye exhibits no discharge. Left eye exhibits no discharge.  Neck: Normal range of motion. Neck supple. No JVD present. No thyromegaly present.  Cardiovascular: Normal rate, regular rhythm and intact distal pulses.  Exam reveals no gallop and no friction rub.   No murmur heard. Pulmonary/Chest: Effort normal and breath sounds normal. No respiratory distress. He has no wheezes. He has no rales. He exhibits no tenderness.  Abdominal: Soft. Bowel sounds are normal. He exhibits no distension and no mass. There is no tenderness. There is no rebound and no guarding.  Genitourinary: Rectum normal and penis normal. Guaiac negative stool. Prostate is enlarged. Prostate is not tender.  Musculoskeletal: Normal range of motion. He exhibits no edema or tenderness.  Lymphadenopathy:    He has no cervical adenopathy.  Neurological: He is alert and oriented to person, place, and time. He displays normal reflexes. He exhibits normal muscle tone.  Skin: Skin is warm and dry. Rash noted. Rash is papular. He is not diaphoretic. No erythema. No pallor.     Psychiatric: He has a normal mood and affect. His behavior is normal. Judgment and thought content normal.   BP 108/64 mmHg  Pulse 56  Temp(Src) 98 F (36.7 C) (Oral)  Ht 5\' 10"  (1.778 m)  Wt 178 lb (80.74 kg)  BMI 25.54 kg/m2  SpO2 97% Wt Readings from Last 3 Encounters:  11/04/14 178 lb (80.74 kg)  04/24/13 186 lb (84.369 kg)  01/30/07 179 lb (81.194 kg)     Lab Results  Component Value Date   WBC 10.9* 01/25/2009   HGB 16.3 01/25/2009  HCT 47.3 01/25/2009   PLT 232 01/25/2009   GLUCOSE 163* 01/25/2009   ALT 135* 01/25/2009   AST 306* 01/25/2009   NA 138 01/25/2009   K 3.6 01/25/2009   CL 96 01/25/2009   CREATININE 1.07 01/25/2009   BUN 9 01/25/2009   CO2 22 01/25/2009    No results found.   Assessment & Plan:  Plan I have discontinued Christopher Bell scopolamine, fluticasone, guaiFENesin-codeine, sildenafil,  diphenoxylate-atropine, and pantoprazole. I have also changed his nizatidine. Additionally, I am having him start on sildenafil. Lastly, I am having him maintain his loperamide and tamsulosin.  Meds ordered this encounter  Medications  . nizatidine (AXID) 75 MG tablet    Sig: Take 1 tablet (75 mg total) by mouth daily.    Dispense:  90 tablet    Refill:  3  . sildenafil (VIAGRA) 25 MG tablet    Sig: Take 1 tablet (25 mg total) by mouth daily as needed for erectile dysfunction.    Dispense:  30 tablet    Refill:  0  . DISCONTD: tamsulosin (FLOMAX) 0.4 MG CAPS capsule    Sig: Take 1 capsule (0.4 mg total) by mouth daily.    Dispense:  30 capsule    Refill:  3  . tamsulosin (FLOMAX) 0.4 MG CAPS capsule    Sig: Take 1 capsule (0.4 mg total) by mouth daily.    Dispense:  90 capsule    Refill:  3    Problem List Items Addressed This Visit    Preventative health care - Primary    ghm utd Check labs Pt will have colonoscopy with Christopher Christopher Bell  See AVS      Relevant Orders   Basic metabolic panel   CBC with Differential/Platelet   Hepatic function panel   Lipid panel   POCT urinalysis dipstick (Completed)   PSA   TSH   HIV antibody   Numerous moles   Relevant Orders   Ambulatory referral to Dermatology   Erectile dysfunction    Pt requesting generic viagra      Relevant Medications   sildenafil (VIAGRA) 25 MG tablet   BARRETTS ESOPHAGUS    con't axid per Christopher Bell       Other Visit Diagnoses    Gastroesophageal reflux disease, esophagitis presence not specified        Relevant Medications    nizatidine (AXID) 75 MG tablet    BPH associated with nocturia        Relevant Medications    tamsulosin (FLOMAX) 0.4 MG CAPS capsule       Follow-up: Return in about 1 year (around 11/04/2015), or if symptoms worsen or fail to improve.  Christopher Koyanagi, DO

## 2014-11-04 NOTE — Assessment & Plan Note (Signed)
ghm utd Check labs Pt will have colonoscopy with Dr Harlene Ramus  See AVS

## 2014-11-11 ENCOUNTER — Telehealth: Payer: Self-pay | Admitting: Family Medicine

## 2014-11-11 DIAGNOSIS — K219 Gastro-esophageal reflux disease without esophagitis: Secondary | ICD-10-CM

## 2014-11-11 MED ORDER — NIZATIDINE 75 MG PO TABS: 75.0000 mg | ORAL_TABLET | Freq: Every day | ORAL | Status: DC

## 2014-11-11 MED ORDER — SILDENAFIL CITRATE 25 MG PO TABS: 25.0000 mg | ORAL_TABLET | Freq: Every day | ORAL | Status: DC | PRN

## 2014-11-11 NOTE — Telephone Encounter (Signed)
Relation to pt: self  Call back number: [phone]   Reason for call:  Patient would like to discuss medication prescribed at last OV, patient does not know the name of medication, but states its the wrong medication.

## 2014-11-11 NOTE — Telephone Encounter (Signed)
Spoke with patient and he wanted to get the Sidenafil sent instead of Viagra. OK per Dr.Lowne..  Rx faxed.     KP

## 2014-11-13 ENCOUNTER — Telehealth: Payer: Self-pay | Admitting: Family Medicine

## 2014-11-13 MED ORDER — SILDENAFIL CITRATE 25 MG PO TABS: 25.0000 mg | ORAL_TABLET | Freq: Every day | ORAL | Status: DC | PRN

## 2014-11-13 NOTE — Telephone Encounter (Signed)
[facility]. Notified pt.

## 2014-11-13 NOTE — Telephone Encounter (Signed)
Pt states that RX for generic viagra 3 months supply went to mail order [facility]. They do not cover it thru the mail order and pt states it would be $200 (name brand I believe). He asked that we send generic in to [facility] [address].

## 2015-01-23 ENCOUNTER — Telehealth: Payer: Self-pay | Admitting: Family Medicine

## 2015-01-23 DIAGNOSIS — Z111 Encounter for screening for respiratory tuberculosis: Secondary | ICD-10-CM

## 2015-01-23 NOTE — Telephone Encounter (Signed)
Pt called and scheduled lab appt for ppd 01/27/15 10:15am. Also scheduled flu shot with nurse 01/27/15 10:30am.

## 2015-01-23 NOTE — Telephone Encounter (Signed)
Please advise if we can to the TB interferon in the lab.     KP

## 2015-01-23 NOTE — Telephone Encounter (Signed)
That is fine--- pt may want to make sure ins will pay for blood test

## 2015-01-23 NOTE — Telephone Encounter (Signed)
Spoke with patient and he stated if the insurance company did not cover the test he would charge it to the company.    KP

## 2015-01-27 ENCOUNTER — Other Ambulatory Visit (INDEPENDENT_AMBULATORY_CARE_PROVIDER_SITE_OTHER): Payer: BLUE CROSS/BLUE SHIELD

## 2015-01-27 ENCOUNTER — Ambulatory Visit (INDEPENDENT_AMBULATORY_CARE_PROVIDER_SITE_OTHER): Payer: BLUE CROSS/BLUE SHIELD

## 2015-01-27 DIAGNOSIS — Z23 Encounter for immunization: Secondary | ICD-10-CM

## 2015-01-27 DIAGNOSIS — Z111 Encounter for screening for respiratory tuberculosis: Secondary | ICD-10-CM | POA: Diagnosis not present

## 2015-01-29 LAB — QUANTIFERON TB GOLD ASSAY (BLOOD)
Interferon Gamma Release Assay: NEGATIVE
Mitogen value: >10 IU/mL
Quantiferon Nil Value: 0.02 IU/mL
Quantiferon Tb Ag Minus Nil Value: 0 IU/mL
TB Ag value: 0.02 IU/mL

## 2015-02-03 ENCOUNTER — Telehealth: Payer: Self-pay | Admitting: Family Medicine

## 2015-02-03 NOTE — Telephone Encounter (Signed)
Spoke with patient and the fax line is secure and he is aware they are being faxed now.      KP

## 2015-02-03 NOTE — Telephone Encounter (Signed)
Relation to RV:IFBP Call back number: [phone]   Reason for call:  Patient requesting TB report reflecting negative results please fax so patient can provide employment fax# [phone]

## 2015-02-05 ENCOUNTER — Other Ambulatory Visit: Payer: Self-pay | Admitting: Family Medicine

## 2015-02-11 ENCOUNTER — Telehealth: Payer: Self-pay | Admitting: Family Medicine

## 2015-02-11 DIAGNOSIS — K219 Gastro-esophageal reflux disease without esophagitis: Secondary | ICD-10-CM

## 2015-02-11 NOTE — Telephone Encounter (Signed)
Pt sees Dr Cristina Gong--- he put pt on axid

## 2015-02-11 NOTE — Telephone Encounter (Signed)
You stopped the Protonix at his CPE in July. He is also on Axid. Please advise if this refill is appropriate.   KP

## 2015-02-11 NOTE — Telephone Encounter (Signed)
Pharmacy: [facility]  Reason for call: Pt needing refill on pantoprazole (PROTONIX) 40 MG tablet. He said it has to be sent in for 90 day supply with 3 refills (for 1 year) because they auto ship and auto bill and with his insurance he cannot opt out. He only has 3 doses left. Requests that we send it in today.

## 2015-02-12 MED ORDER — PANTOPRAZOLE SODIUM 40 MG PO TBEC: 40.0000 mg | DELAYED_RELEASE_TABLET | Freq: Every day | ORAL | Status: DC

## 2015-02-12 NOTE — Telephone Encounter (Signed)
Rx sent,     KP

## 2015-02-12 NOTE — Telephone Encounter (Signed)
Ok to refill for a year? 

## 2015-02-12 NOTE — Telephone Encounter (Signed)
Patient states Dr Etter Sjogren approved this at last visit. Please advise

## 2015-02-24 ENCOUNTER — Telehealth: Payer: Self-pay | Admitting: Family Medicine

## 2015-02-24 MED ORDER — LOPERAMIDE HCL 2 MG PO CAPS: 4.0000 mg | ORAL_CAPSULE | Freq: Three times a day (TID) | ORAL | Status: DC

## 2015-02-24 NOTE — Telephone Encounter (Signed)
Rx re-faxed    KP 

## 2015-02-24 NOTE — Telephone Encounter (Signed)
Pharmacy: [facility]  Reason for call: Pt asking for 3 month supply of loperamide with 1 year refills. Pt has 1 week on hand.

## 2015-02-26 ENCOUNTER — Encounter: Payer: Self-pay | Admitting: Family Medicine

## 2015-06-09 ENCOUNTER — Ambulatory Visit (INDEPENDENT_AMBULATORY_CARE_PROVIDER_SITE_OTHER): Payer: BLUE CROSS/BLUE SHIELD | Admitting: Family Medicine

## 2015-06-09 ENCOUNTER — Encounter: Payer: Self-pay | Admitting: Family Medicine

## 2015-06-09 VITALS — BP 112/74 | HR 71 | Temp 99.3°F | Wt 165.0 lb

## 2015-06-09 DIAGNOSIS — L247 Irritant contact dermatitis due to plants, except food: Secondary | ICD-10-CM | POA: Diagnosis not present

## 2015-06-09 MED ORDER — PREDNISONE 10 MG PO TABS: ORAL_TABLET | ORAL | Status: DC

## 2015-06-09 MED ORDER — METHYLPREDNISOLONE ACETATE 80 MG/ML IJ SUSP
80.0000 mg | Freq: Once | INTRAMUSCULAR | Status: AC
  Administered 2015-06-09: 80 mg via INTRAMUSCULAR

## 2015-06-09 NOTE — Addendum Note (Signed)
Addended by: Ewing Schlein on: 06/09/2015 03:58 PM   Modules accepted: Orders

## 2015-06-09 NOTE — Progress Notes (Signed)
Pre visit review using our clinic review tool, if applicable. No additional management support is needed unless otherwise documented below in the visit note. 

## 2015-06-09 NOTE — Progress Notes (Signed)
Patient ID: Christopher Bell, male    DOB: Oct 13, 1960  Age: 55 y.o. MRN: [id number]    Subjective:  Subjective HPI Christopher Bell presents for rash on abd and arms.  Pt thought it was shingles.  It is not painful.  It itches like crazy.  He has been working in the yard.  No new soaps, lotions detergents.   Review of Systems  Constitutional: Negative for diaphoresis, appetite change, fatigue and unexpected weight change.  Eyes: Negative for pain, redness and visual disturbance.  Respiratory: Negative for cough, chest tightness, shortness of breath and wheezing.   Cardiovascular: Negative for chest pain, palpitations and leg swelling.  Endocrine: Negative for cold intolerance, heat intolerance, polydipsia, polyphagia and polyuria.  Genitourinary: Negative for dysuria, frequency and difficulty urinating.  Neurological: Negative for dizziness, light-headedness, numbness and headaches.  Psychiatric/Behavioral: Negative for decreased concentration. The patient is not nervous/anxious.     History Past Medical History  Diagnosis Date  . GERD (gastroesophageal reflux disease)     He has no past surgical history on file.   His family history is not on file.He reports that he has never smoked. He has never used smokeless tobacco. He reports that he does not drink alcohol or use illicit drugs.  Current Outpatient Prescriptions on File Prior to Visit  Medication Sig Dispense Refill  . loperamide (IMODIUM) 2 MG capsule Take 2 capsules (4 mg total) by mouth 3 (three) times daily. 540 capsule 3  . nizatidine (AXID) 75 MG tablet Take 1 tablet (75 mg total) by mouth daily. 90 tablet 3  . pantoprazole (PROTONIX) 40 MG tablet Take 1 tablet (40 mg total) by mouth daily. 90 tablet 3  . sildenafil (VIAGRA) 25 MG tablet Take 1 tablet (25 mg total) by mouth daily as needed for erectile dysfunction. 30 tablet 0  . tamsulosin (FLOMAX) 0.4 MG CAPS capsule Take 1 capsule (0.4 mg total) by mouth daily. 90 capsule  3   No current facility-administered medications on file prior to visit.     Objective:  Objective Physical Exam  Constitutional: He is oriented to person, place, and time. Vital signs are normal. He appears well-developed and well-nourished. He is sleeping.  HENT:  Head: Normocephalic and atraumatic.  Mouth/Throat: Oropharynx is clear and moist.  Eyes: EOM are normal. Pupils are equal, round, and reactive to light.  Neck: Normal range of motion. Neck supple. No thyromegaly present.  Cardiovascular: Normal rate and regular rhythm.   No murmur heard. Pulmonary/Chest: Effort normal and breath sounds normal. No respiratory distress. He has no wheezes. He has no rales. He exhibits no tenderness.  Musculoskeletal: He exhibits no edema or tenderness.  Neurological: He is alert and oriented to person, place, and time.  Skin: Skin is warm and dry.     Psychiatric: He has a normal mood and affect. His behavior is normal. Judgment and thought content normal.  Nursing note and vitals reviewed.  BP 112/74 mmHg  Pulse 71  Temp(Src) 99.3 F (37.4 C) (Oral)  Wt 165 lb (74.844 kg)  SpO2 98% Wt Readings from Last 3 Encounters:  06/09/15 165 lb (74.844 kg)  11/04/14 178 lb (80.74 kg)  04/24/13 186 lb (84.369 kg)     Lab Results  Component Value Date   WBC 6.0 11/04/2014   HGB 13.9 11/04/2014   HCT 41.0 11/04/2014   PLT 264.0 11/04/2014   GLUCOSE 88 11/04/2014   CHOL 226* 11/04/2014   TRIG 215.0* 11/04/2014   HDL 47.40 11/04/2014  LDLDIRECT 145.0 11/04/2014   ALT 13 11/04/2014   AST 18 11/04/2014   NA 140 11/04/2014   K 4.2 11/04/2014   CL 101 11/04/2014   CREATININE 0.92 11/04/2014   BUN 11 11/04/2014   CO2 30 11/04/2014   TSH 1.51 11/04/2014   PSA 0.54 11/04/2014    No results found.   Assessment & Plan:  Plan I am having Christopher Bell start on predniSONE. I am also having him maintain his tamsulosin, nizatidine, sildenafil, pantoprazole, and loperamide.  Meds ordered  this encounter  Medications  . predniSONE (DELTASONE) 10 MG tablet    Sig: 3 po qd for 3 days then 2 po qd for 3 days the 1 po qd for 3 days    Dispense:  18 tablet    Refill:  0    Problem List Items Addressed This Visit    None    Visit Diagnoses    Contact dermatitis and eczema due to plant    -  Primary    Relevant Medications    predniSONE (DELTASONE) 10 MG tablet     depo medrol 80 mg IM Benadryl prn itching rto prn  Follow-up: Return if symptoms worsen or fail to improve, for lipid, hep bmp hgba1c, ua, microalbumin.  Garnet Koyanagi, DO

## 2015-06-09 NOTE — Patient Instructions (Signed)
Contact Dermatitis Dermatitis is redness, soreness, and swelling (inflammation) of the skin. Contact dermatitis is a reaction to certain substances that touch the skin. There are two types of contact dermatitis:   Irritant contact dermatitis. This type is caused by something that irritates your skin, such as dry hands from washing them too much. This type does not require previous exposure to the substance for a reaction to occur. This type is more common.  Allergic contact dermatitis. This type is caused by a substance that you are allergic to, such as a nickel allergy or poison ivy. This type only occurs if you have been exposed to the substance (allergen) before. Upon a repeat exposure, your body reacts to the substance. This type is less common. CAUSES  Many different substances can cause contact dermatitis. Irritant contact dermatitis is most commonly caused by exposure to:   Makeup.   Soaps.   Detergents.   Bleaches.   Acids.   Metal salts, such as nickel.  Allergic contact dermatitis is most commonly caused by exposure to:   Poisonous plants.   Chemicals.   Jewelry.   Latex.   Medicines.   Preservatives in products, such as clothing.  RISK FACTORS This condition is more likely to develop in:   People who have jobs that expose them to irritants or allergens.  People who have certain medical conditions, such as asthma or eczema.  SYMPTOMS  Symptoms of this condition may occur anywhere on your body where the irritant has touched you or is touched by you. Symptoms include:  Dryness or flaking.   Redness.   Cracks.   Itching.   Pain or a burning feeling.   Blisters.  Drainage of small amounts of blood or clear fluid from skin cracks. With allergic contact dermatitis, there may also be swelling in areas such as the eyelids, mouth, or genitals.  DIAGNOSIS  This condition is diagnosed with a medical history and physical exam. A patch skin test  may be performed to help determine the cause. If the condition is related to your job, you may need to see an occupational medicine specialist. TREATMENT Treatment for this condition includes figuring out what caused the reaction and protecting your skin from further contact. Treatment may also include:   Steroid creams or ointments. Oral steroid medicines may be needed in more severe cases.  Antibiotics or antibacterial ointments, if a skin infection is present.  Antihistamine lotion or an antihistamine taken by mouth to ease itching.  A bandage (dressing). HOME CARE INSTRUCTIONS Skin Care  Moisturize your skin as needed.   Apply cool compresses to the affected areas.  Try taking a bath with:  Epsom salts. Follow the instructions on the packaging. You can get these at your local pharmacy or grocery store.  Baking soda. Pour a small amount into the bath as directed by your health care provider.  Colloidal oatmeal. Follow the instructions on the packaging. You can get this at your local pharmacy or grocery store.  Try applying baking soda paste to your skin. Stir water into baking soda until it reaches a paste-like consistency.  Do not scratch your skin.  Bathe less frequently, such as every other day.  Bathe in lukewarm water. Avoid using hot water. Medicines  Take or apply over-the-counter and prescription medicines only as told by your health care provider.   If you were prescribed an antibiotic medicine, take or apply your antibiotic as told by your health care provider. Do not stop using the   antibiotic even if your condition starts to improve. General Instructions  Keep all follow-up visits as told by your health care provider. This is important.  Avoid the substance that caused your reaction. If you do not know what caused it, keep a journal to try to track what caused it. Write down:  What you eat.  What cosmetic products you use.  What you drink.  What  you wear in the affected area. This includes jewelry.  If you were given a dressing, take care of it as told by your health care provider. This includes when to change and remove it. SEEK MEDICAL CARE IF:   Your condition does not improve with treatment.  Your condition gets worse.  You have signs of infection such as swelling, tenderness, redness, soreness, or warmth in the affected area.  You have a fever.  You have new symptoms. SEEK IMMEDIATE MEDICAL CARE IF:   You have a severe headache, neck pain, or neck stiffness.  You vomit.  You feel very sleepy.  You notice red streaks coming from the affected area.  Your bone or joint underneath the affected area becomes painful after the skin has healed.  The affected area turns darker.  You have difficulty breathing.   This information is not intended to replace advice given to you by your health care provider. Make sure you discuss any questions you have with your health care provider.   Document Released: 04/02/2000 Document Revised: 12/25/2014 Document Reviewed: 08/21/2014 Elsevier Interactive Patient Education 2016 Elsevier Inc.  

## 2015-06-12 ENCOUNTER — Telehealth: Payer: Self-pay | Admitting: Family Medicine

## 2015-06-12 DIAGNOSIS — D229 Melanocytic nevi, unspecified: Secondary | ICD-10-CM

## 2015-06-12 NOTE — Telephone Encounter (Signed)
Can be reached: [phone]   Reason for call: pt has f/u questions about rash and trx

## 2015-06-12 NOTE — Telephone Encounter (Signed)
Spoke with patient and he has requested to see [facility] for the moles that he has on his face. He was unhappy with the previous Dermatologist. Ref has been placed.    KP

## 2015-07-28 ENCOUNTER — Telehealth: Payer: Self-pay | Admitting: Family Medicine

## 2015-07-28 DIAGNOSIS — K219 Gastro-esophageal reflux disease without esophagitis: Secondary | ICD-10-CM

## 2015-07-28 NOTE — Telephone Encounter (Signed)
Pt states that he tried to refill his pantoprazole and was told by [facility] that it requires PA and they faxed info to [place]. They gave pt phone # of [phone] for PA dept to expedite

## 2015-07-28 NOTE — Telephone Encounter (Signed)
Still awaiting form

## 2015-07-29 NOTE — Telephone Encounter (Signed)
Pt is calling again and asked if we can call the PA ph#. He said mail order told him we need to call them.

## 2015-08-04 ENCOUNTER — Telehealth: Payer: Self-pay | Admitting: Family Medicine

## 2015-08-04 MED ORDER — PANTOPRAZOLE SODIUM 40 MG PO TBEC
40.0000 mg | DELAYED_RELEASE_TABLET | Freq: Every day | ORAL | Status: DC
Start: 2015-08-04 — End: 2015-08-11

## 2015-08-04 MED ORDER — ZOSTER VACCINE LIVE 19400 UNT/0.65ML ~~LOC~~ SOLR: 0.6500 mL | Freq: Once | SUBCUTANEOUS | Status: DC

## 2015-08-04 NOTE — Telephone Encounter (Signed)
I spoke with the patient and I made him aware we have to submit the refill prior to the pharmacy sending the PA request since he received a coverage review letter. I advised that once the Rx is received and rejected the insurance company would send the information to [place] for [place] to complete the form. He said no one explained the process and he wanted to know why, I advised I was not sure if the scheduler's are aware of the process however I have faxed the Rx and Janett Billow will handle the PA once we get the information back, I asked about the past med's and he stated he has taken Zantac and Nexium without relief. He has also tried Axid in the past and that did help. I advised I would forward the information to [place].    KP

## 2015-08-04 NOTE — Telephone Encounter (Signed)
Ok to send

## 2015-08-04 NOTE — Telephone Encounter (Signed)
Please advise      KP 

## 2015-08-04 NOTE — Telephone Encounter (Signed)
Patient aware the Rx has been sent.       KP 

## 2015-08-04 NOTE — Telephone Encounter (Signed)
LVM advising patient of message below °

## 2015-08-04 NOTE — Telephone Encounter (Signed)
xyzal 5 mg daily #30  5 refills flonase 2 spays each nostril qd   Would need ov for abx

## 2015-08-04 NOTE — Telephone Encounter (Signed)
Patient would like to discuss the PA below. Patient requesting to speak with Maudie Mercury specifically. Best [phone]

## 2015-08-04 NOTE — Telephone Encounter (Signed)
Relation to [id number] Call back number:[phone] [id number] [place], [place] [phone] (Phone) [phone] (Fax)         Reason for call:  Patient would like shingles vaccination script sent to retail pharmacy. Patient states he contacted insurance to verify if covered.

## 2015-08-04 NOTE — Telephone Encounter (Signed)
Spoke with patient and he stated he is having HA, coughing, discolored phlegm, sinus pressure and he is out of town for work. No fever, no chills, he is sleeping in, drinking plenty of fluids, he will not be back in town until next Wednesday and he wanted to know if he could get something called in.          KP

## 2015-08-04 NOTE — Telephone Encounter (Signed)
Shingles vaccine has been faxed.     KP

## 2015-08-05 MED ORDER — LEVOCETIRIZINE DIHYDROCHLORIDE 5 MG PO TABS: 5.0000 mg | ORAL_TABLET | Freq: Every evening | ORAL | Status: DC

## 2015-08-05 MED ORDER — FLUTICASONE PROPIONATE 50 MCG/ACT NA SUSP: 2.0000 | Freq: Every day | NASAL | Status: DC

## 2015-08-05 NOTE — Addendum Note (Signed)
Addended by: Ewing Schlein on: 08/05/2015 12:09 PM   Modules accepted: Orders

## 2015-08-05 NOTE — Telephone Encounter (Signed)
Detailed message left with the below information on Cell phone VM and to call back with any questions or concerns.    KP

## 2015-08-07 NOTE — Telephone Encounter (Signed)
All information noted below regarding failed medications all submitted to [facility]. Awaiting determination. JG//CMA

## 2015-08-11 MED ORDER — PANTOPRAZOLE SODIUM 40 MG PO TBEC
40.0000 mg | DELAYED_RELEASE_TABLET | Freq: Every day | ORAL | Status: DC
Start: 2015-08-11 — End: 2016-08-24

## 2015-08-11 NOTE — Telephone Encounter (Addendum)
Call from Christopher Bell at [employer] advising the patient would have had to tried and failed Omeprazole prior to this medication being approved. I advised of what the patient has taken in the past and he stated I would need to speak to a PA representative. Spoke with PA and they said the PA form that was faxed stated the patient did not take 2 OTC PPI's and I made her aware that the patient has tried Zantac and Zegrid per patient, he has also taken Axid and Nexium. He has had no relief from the previous PPI's. She made me aware that she would submit a new PA over the phone and it was approved from 08/11/15 until 08/10/16 case ID# [id number].

## 2015-08-11 NOTE — Addendum Note (Signed)
Addended by: Ewing Schlein on: 08/11/2015 03:55 PM   Modules accepted: Orders

## 2015-08-12 NOTE — Telephone Encounter (Signed)
PA approved effective from 08/11/15 through 07/21/16. Ref #: [id number]. Approval letter sent for scanning. JG//CMA

## 2015-09-01 DIAGNOSIS — D485 Neoplasm of uncertain behavior of skin: Secondary | ICD-10-CM | POA: Diagnosis not present

## 2015-09-01 DIAGNOSIS — D2239 Melanocytic nevi of other parts of face: Secondary | ICD-10-CM | POA: Diagnosis not present

## 2015-09-01 DIAGNOSIS — L570 Actinic keratosis: Secondary | ICD-10-CM | POA: Diagnosis not present

## 2016-01-21 ENCOUNTER — Ambulatory Visit (INDEPENDENT_AMBULATORY_CARE_PROVIDER_SITE_OTHER): Payer: BLUE CROSS/BLUE SHIELD | Admitting: *Deleted

## 2016-01-21 ENCOUNTER — Telehealth: Payer: Self-pay | Admitting: Family Medicine

## 2016-01-21 DIAGNOSIS — Z111 Encounter for screening for respiratory tuberculosis: Secondary | ICD-10-CM

## 2016-01-21 DIAGNOSIS — Z23 Encounter for immunization: Secondary | ICD-10-CM | POA: Diagnosis not present

## 2016-01-21 NOTE — Telephone Encounter (Signed)
Discussed w/ Mackie Pai, PA-C. Okay for TB skin test at pt's convenience. Called pt and scheduled nurse visit for today. Pt would also like to have flu shot at nurse visit.

## 2016-01-21 NOTE — Telephone Encounter (Signed)
Please advise      KP 

## 2016-01-21 NOTE — Progress Notes (Signed)
Pre visit review using our clinic review tool, if applicable. No additional management support is needed unless otherwise documented below in the visit note.  Pt here for PPD placement and flu shot. [place] for PPD per phone note dated for today.  PPD Placement note Christopher Bell, 55 y.o. male is here today for placement of PPD test Reason for PPD test: Required by employer Pt taken PPD test before: yes Verified in allergy area and with patient that they are not allergic to the products PPD is made of (Phenol or Tween). Yes Has the patient ever received the BCG vaccine?: no Has the patient been in recent contact with anyone known or suspected of having active TB disease?: no  Patient's Country of origin?: [place]  O: Alert and oriented in NAD. P:  PPD placed on 01/21/2016.  Patient advised to return for reading within 48-72 hours.

## 2016-01-21 NOTE — Telephone Encounter (Signed)
Relation to [id number] Call back [phone] Pharmacy:  Reason for call:  Patient requesting TB orders please advise

## 2016-01-23 ENCOUNTER — Telehealth: Payer: Self-pay

## 2016-01-23 LAB — TB SKIN TEST
Induration: 0 mm
TB Skin Test: NEGATIVE

## 2016-01-23 NOTE — Telephone Encounter (Signed)
Pt returned to clinic for PPD reading.  Results: Negative, 46mm induration.  Letter printed for work.

## 2016-05-14 ENCOUNTER — Other Ambulatory Visit: Payer: Self-pay | Admitting: Family Medicine

## 2016-05-14 DIAGNOSIS — K219 Gastro-esophageal reflux disease without esophagitis: Secondary | ICD-10-CM

## 2016-05-14 NOTE — Telephone Encounter (Signed)
Caller name: Relationship to patient: Self Can be reached: [phone] Pharmacy:  Reason for call: Request call back to discuss refills. Have questions about medications

## 2016-05-18 NOTE — Telephone Encounter (Signed)
Called left message to call back 

## 2016-05-19 MED ORDER — LOPERAMIDE HCL 2 MG PO CAPS: 4.0000 mg | ORAL_CAPSULE | Freq: Three times a day (TID) | ORAL | 0 refills | Status: DC

## 2016-05-19 NOTE — Addendum Note (Signed)
Addended by: Sharon Seller B on: 05/19/2016 03:08 PM   Modules accepted: Orders

## 2016-05-19 NOTE — Telephone Encounter (Signed)
Called left message to call back 

## 2016-05-19 NOTE — Telephone Encounter (Signed)
Sent in just Loperamide/made an appt. Also in 90 days on 08/23/16 with PCP for check up.

## 2016-05-19 NOTE — Addendum Note (Signed)
Addended by: Sharon Seller B on: 05/19/2016 03:12 PM   Modules accepted: Orders

## 2016-08-23 ENCOUNTER — Ambulatory Visit: Payer: BLUE CROSS/BLUE SHIELD | Admitting: Family Medicine

## 2016-08-24 ENCOUNTER — Encounter: Payer: BLUE CROSS/BLUE SHIELD | Admitting: Family Medicine

## 2016-08-24 ENCOUNTER — Ambulatory Visit: Payer: BLUE CROSS/BLUE SHIELD | Admitting: Family Medicine

## 2016-08-24 ENCOUNTER — Telehealth: Payer: Self-pay | Admitting: Family Medicine

## 2016-08-24 DIAGNOSIS — K219 Gastro-esophageal reflux disease without esophagitis: Secondary | ICD-10-CM

## 2016-08-24 DIAGNOSIS — B009 Herpesviral infection, unspecified: Secondary | ICD-10-CM

## 2016-08-24 MED ORDER — PANTOPRAZOLE SODIUM 40 MG PO TBEC: 40.0000 mg | DELAYED_RELEASE_TABLET | Freq: Every day | ORAL | 3 refills | Status: DC

## 2016-08-24 MED ORDER — VALACYCLOVIR HCL 1 G PO TABS: 1000.0000 mg | ORAL_TABLET | Freq: Every day | ORAL | 1 refills | Status: DC

## 2016-08-24 MED ORDER — LOPERAMIDE HCL 2 MG PO CAPS: 4.0000 mg | ORAL_CAPSULE | Freq: Three times a day (TID) | ORAL | 3 refills | Status: DC

## 2016-08-24 NOTE — Progress Notes (Signed)
Pre visit review using our clinic review tool, if applicable. No additional management support is needed unless otherwise documented below in the visit note. 

## 2016-08-24 NOTE — Telephone Encounter (Signed)
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  He takes Valtrex as needed, but he takes his wife rx.  He would like to have his own rx.  The way that it is written for his wife is   Valtrex 1000mg    1/2 tablet every day as needed  I put the diagnosis in his history.  Can we do a year rx refill for him for this?  His appt for CPE is scheduled for 10/29/16.

## 2016-08-24 NOTE — Telephone Encounter (Signed)
PT Came in Req Loperamide HCI (Cap) (IMODIUM  2MG  90 DAY SUPPLY With Enough REFILLS For A YEAR & Pantoprazole Sodium (tablet delayed response) PROTONIX 40MG  90 Day supply with refills for one year. Pt req call in to [facility]. Pt wants it noted he has appt for CPE July 13th. cb

## 2016-08-24 NOTE — Telephone Encounter (Signed)
Patient informed prescription sent in

## 2016-08-24 NOTE — Telephone Encounter (Signed)
Valtrex 1g  #30 1 po qd 2 refills Will give more at cpe

## 2016-08-25 ENCOUNTER — Telehealth: Payer: Self-pay | Admitting: Family Medicine

## 2016-08-25 DIAGNOSIS — K219 Gastro-esophageal reflux disease without esophagitis: Secondary | ICD-10-CM

## 2016-08-25 NOTE — Telephone Encounter (Signed)
Caller name: Relationship to patient: Self Can be reached: [phone]  Pharmacy:  [place], [place] [phone] (Phone) [phone] (Fax)     Reason for call: Needs Rx for pantoprazole (PROTONIX) 40 MG tablet [[id number]]

## 2016-08-26 MED ORDER — PANTOPRAZOLE SODIUM 40 MG PO TBEC: 40.0000 mg | DELAYED_RELEASE_TABLET | Freq: Every day | ORAL | 3 refills | Status: DC

## 2016-08-26 NOTE — Telephone Encounter (Signed)
Sent in prescription to pharmacy.

## 2016-09-03 NOTE — Progress Notes (Signed)
This encounter was created in error - please disregard.

## 2016-10-29 ENCOUNTER — Encounter: Payer: Self-pay | Admitting: Family Medicine

## 2016-10-29 ENCOUNTER — Ambulatory Visit (INDEPENDENT_AMBULATORY_CARE_PROVIDER_SITE_OTHER): Payer: BLUE CROSS/BLUE SHIELD | Admitting: Family Medicine

## 2016-10-29 VITALS — BP 106/60 | HR 58 | Temp 98.4°F | Resp 16 | Ht 70.0 in | Wt 179.0 lb

## 2016-10-29 DIAGNOSIS — N529 Male erectile dysfunction, unspecified: Secondary | ICD-10-CM | POA: Diagnosis not present

## 2016-10-29 DIAGNOSIS — Z0000 Encounter for general adult medical examination without abnormal findings: Secondary | ICD-10-CM | POA: Diagnosis not present

## 2016-10-29 LAB — CBC WITH DIFFERENTIAL/PLATELET
Basophils Absolute: 78 cells/uL (ref 0–200)
Basophils Relative: 1 %
Eosinophils Absolute: 156 cells/uL (ref 15–500)
Eosinophils Relative: 2 %
HCT: 40 % (ref 38.5–50.0)
Hemoglobin: 13.5 g/dL (ref 13.2–17.1)
Lymphocytes Relative: 38 %
Lymphs Abs: 2964 cells/uL (ref 850–3900)
MCH: 30.4 pg (ref 27.0–33.0)
MCHC: 33.8 g/dL (ref 32.0–36.0)
MCV: 90.1 fL (ref 80.0–100.0)
MPV: 10.2 fL (ref 7.5–12.5)
Monocytes Absolute: 468 cells/uL (ref 200–950)
Monocytes Relative: 6 %
Neutro Abs: 4134 cells/uL (ref 1500–7800)
Neutrophils Relative %: 53 %
Platelets: 286 10*3/uL (ref 140–400)
RBC: 4.44 MIL/uL (ref 4.20–5.80)
RDW: 12.8 % (ref 11.0–15.0)
WBC: 7.8 10*3/uL (ref 3.8–10.8)

## 2016-10-29 LAB — TSH: TSH: 1.35 mIU/L (ref 0.40–4.50)

## 2016-10-29 MED ORDER — SILDENAFIL CITRATE 100 MG PO TABS: 50.0000 mg | ORAL_TABLET | Freq: Every day | ORAL | 11 refills | Status: DC | PRN

## 2016-10-29 MED ORDER — VALACYCLOVIR HCL 1 G PO TABS: 1000.0000 mg | ORAL_TABLET | Freq: Every day | ORAL | 1 refills | Status: DC

## 2016-10-29 NOTE — Progress Notes (Signed)
Patient ID: Christopher Bell, male   DOB: 06/15/1960, 56 y.o.   MRN: [id number]     Subjective:  I acted as a Education administrator for Dr. Carollee Herter.  [name], [name]   Patient ID: Christopher Bell, male    DOB: [birth year], 56 y.o.   MRN: [id number]  Chief Complaint  Patient presents with  . Annual Exam    HPI  Patient is in today for annual exam.  Patient Care Team: Carollee Herter, Alferd Apa, DO as PCP - General   Past Medical History:  Diagnosis Date  . GERD (gastroesophageal reflux disease)     No past surgical history on file.  No family history on file.  Social History   Social History  . Marital status: Married    Spouse name: N/A  . Number of children: N/A  . Years of education: N/A   Occupational History  . SALES MANAGER    Social History Main Topics  . Smoking status: Never Smoker  . Smokeless tobacco: Never Used  . Alcohol use No  . Drug use: No  . Sexual activity: Not on file   Other Topics Concern  . Not on file   Social History Narrative   Exercise-no-walks a lot at work          Outpatient Medications Prior to Visit  Medication Sig Dispense Refill  . loperamide (IMODIUM) 2 MG capsule Take 2 capsules (4 mg total) by mouth 3 (three) times daily. 540 capsule 3  . nizatidine (AXID) 75 MG tablet Take 1 tablet (75 mg total) by mouth daily. 90 tablet 3  . pantoprazole (PROTONIX) 40 MG tablet Take 1 tablet (40 mg total) by mouth daily. 90 tablet 3  . sildenafil (VIAGRA) 25 MG tablet Take 1 tablet (25 mg total) by mouth daily as needed for erectile dysfunction. 30 tablet 0  . valACYclovir (VALTREX) 1000 MG tablet Take 1 tablet (1,000 mg total) by mouth daily. 30 tablet 1  . fluticasone (FLONASE) 50 MCG/ACT nasal spray Place 2 sprays into both nostrils daily. 16 g 6  . levocetirizine (XYZAL) 5 MG tablet Take 1 tablet (5 mg total) by mouth every evening. 30 tablet 5  . predniSONE (DELTASONE) 10 MG tablet 3 po qd for 3 days then 2 po qd for 3 days the 1 po qd for 3 days 18  tablet 0  . tamsulosin (FLOMAX) 0.4 MG CAPS capsule Take 1 capsule (0.4 mg total) by mouth daily. 90 capsule 3  . zoster vaccine live, PF, (ZOSTAVAX) 47096 UNT/0.65ML injection Inject 19,400 Units into the skin once. 1 each 0   No facility-administered medications prior to visit.     No Known Allergies  Review of Systems  Constitutional: Negative for fever and malaise/fatigue.  HENT: Negative for congestion.   Eyes: Negative for blurred vision.  Respiratory: Negative for cough and shortness of breath.   Cardiovascular: Negative for chest pain, palpitations and leg swelling.  Gastrointestinal: Negative for vomiting.  Musculoskeletal: Negative for back pain.  Skin: Negative for rash.  Neurological: Negative for loss of consciousness and headaches.       Objective:    Physical Exam  Constitutional: He appears well-developed and well-nourished. No distress.  HENT:  Head: Normocephalic and atraumatic.  Eyes: Conjunctivae are normal.  Neck: Normal range of motion. No thyromegaly present.  Cardiovascular: Normal rate and regular rhythm.   Pulmonary/Chest: Effort normal. He has no wheezes.  Abdominal: Soft. Bowel sounds are normal. There is no tenderness.  Genitourinary:  Genitourinary Comments: Pt refused prostate and testicular exam  Musculoskeletal: Normal range of motion. He exhibits no edema or deformity.  Neurological: He is alert.  Skin: Skin is warm and dry. He is not diaphoretic.  Psychiatric: He has a normal mood and affect.  Nursing note and vitals reviewed.   BP 106/60 (BP Location: Left Arm, Cuff Size: Normal)   Pulse (!) 58   Temp 98.4 F (36.9 C) (Oral)   Resp 16   Ht 5\' 10"  (1.778 m)   Wt 179 lb (81.2 kg)   SpO2 98%   BMI 25.68 kg/m  Wt Readings from Last 3 Encounters:  10/29/16 179 lb (81.2 kg)  06/09/15 165 lb (74.8 kg)  11/04/14 178 lb (80.7 kg)   BP Readings from Last 3 Encounters:  10/29/16 106/60  06/09/15 112/74  11/04/14 108/64      Immunization History  Administered Date(s) Administered  . Influenza Split 02/09/2011  . Influenza Whole 02/03/2009  . Influenza,inj,Quad PF,36+ Mos 01/22/2013, 01/10/2014, 01/27/2015, 01/21/2016  . PPD Test 02/16/2011, 02/02/2012, 01/22/2013, 01/08/2014, 01/21/2016  . Tdap 02/09/2011  . Zoster 08/07/2015    Health Maintenance  Topic Date Due  . Hepatitis C Screening  Feb 17, 1961  . INFLUENZA VACCINE  11/17/2016  . TETANUS/TDAP  02/08/2021  . COLONOSCOPY  02/02/2025  . HIV Screening  Completed    Lab Results  Component Value Date   WBC 7.8 10/29/2016   HGB 13.5 10/29/2016   HCT 40.0 10/29/2016   PLT 286 10/29/2016   GLUCOSE 88 11/04/2014   CHOL 226 (H) 11/04/2014   TRIG 215.0 (H) 11/04/2014   HDL 47.40 11/04/2014   LDLDIRECT 145.0 11/04/2014   ALT 13 11/04/2014   AST 18 11/04/2014   NA 140 11/04/2014   K 4.2 11/04/2014   CL 101 11/04/2014   CREATININE 0.92 11/04/2014   BUN 11 11/04/2014   CO2 30 11/04/2014   TSH 1.51 11/04/2014   PSA 0.54 11/04/2014    Lab Results  Component Value Date   TSH 1.51 11/04/2014   Lab Results  Component Value Date   WBC 7.8 10/29/2016   HGB 13.5 10/29/2016   HCT 40.0 10/29/2016   MCV 90.1 10/29/2016   PLT 286 10/29/2016   Lab Results  Component Value Date   NA 140 11/04/2014   K 4.2 11/04/2014   CO2 30 11/04/2014   GLUCOSE 88 11/04/2014   BUN 11 11/04/2014   CREATININE 0.92 11/04/2014   BILITOT 0.5 11/04/2014   ALKPHOS 60 11/04/2014   AST 18 11/04/2014   ALT 13 11/04/2014   PROT 7.3 11/04/2014   ALBUMIN 4.5 11/04/2014   CALCIUM 9.3 11/04/2014   GFR 91.37 11/04/2014   Lab Results  Component Value Date   CHOL 226 (H) 11/04/2014   Lab Results  Component Value Date   HDL 47.40 11/04/2014   No results found for: [facility] Lab Results  Component Value Date   TRIG 215.0 (H) 11/04/2014   Lab Results  Component Value Date   CHOLHDL 5 11/04/2014   No results found for: HGBA1C       Assessment & Plan:    Problem List Items Addressed This Visit      Unprioritized   Erectile dysfunction   Relevant Medications   sildenafil (VIAGRA) 100 MG tablet   Other Relevant Orders   PSA   Preventative health care - Primary   Relevant Orders   Hepatitis C antibody (Completed)   CBC with Differential/Platelet (Completed)   Comprehensive metabolic  panel (Completed)   TSH (Completed)   POCT Urinalysis Dipstick (Automated)   PSA       See avs ghm utd Check labs D/w shingrix 1. Preventative health care See above - Hepatitis C antibody - CBC with Differential/Platelet - Comprehensive metabolic panel - TSH - POCT Urinalysis Dipstick (Automated) - PSA  2. Erectile dysfunction, unspecified erectile dysfunction type   - sildenafil (VIAGRA) 100 MG tablet; Take 0.5-1 tablets (50-100 mg total) by mouth daily as needed for erectile dysfunction.  Dispense: 10 tablet; Refill: 11 - PSA   I have discontinued Mr. Goins tamsulosin, sildenafil, predniSONE, zoster vaccine live (PF), levocetirizine, fluticasone, and pantoprazole. I am also having him start on sildenafil. Additionally, I am having him maintain his nizatidine, loperamide, and valACYclovir.  Meds ordered this encounter  Medications  . sildenafil (VIAGRA) 100 MG tablet    Sig: Take 0.5-1 tablets (50-100 mg total) by mouth daily as needed for erectile dysfunction.    Dispense:  10 tablet    Refill:  11  . valACYclovir (VALTREX) 1000 MG tablet    Sig: Take 1 tablet (1,000 mg total) by mouth daily.    Dispense:  30 tablet    Refill:  1    CMA served as scribe during this visit. History, Physical and Plan performed by medical provider. Documentation and orders reviewed and attested to.  Ann Held, DO

## 2016-10-29 NOTE — Patient Instructions (Signed)
Preventive Care 40-64 Years, Male Preventive care refers to lifestyle choices and visits with your health care provider that can promote health and wellness. What does preventive care include?  A yearly physical exam. This is also called an annual well check.  Dental exams once or twice a year.  Routine eye exams. Ask your health care provider how often you should have your eyes checked.  Personal lifestyle choices, including: ? Daily care of your teeth and gums. ? Regular physical activity. ? Eating a healthy diet. ? Avoiding tobacco and drug use. ? Limiting alcohol use. ? Practicing safe sex. ? Taking low-dose aspirin every day starting at age 55. What happens during an annual well check? The services and screenings done by your health care provider during your annual well check will depend on your age, overall health, lifestyle risk factors, and family history of disease. Counseling Your health care provider may ask you questions about your:  Alcohol use.  Tobacco use.  Drug use.  Emotional well-being.  Home and relationship well-being.  Sexual activity.  Eating habits.  Work and work Statistician.  Screening You may have the following tests or measurements:  Height, weight, and BMI.  Blood pressure.  Lipid and cholesterol levels. These may be checked every 5 years, or more frequently if you are over 55 years old.  Skin check.  Lung cancer screening. You may have this screening every year starting at age 55 if you have a 30-pack-year history of smoking and currently smoke or have quit within the past 15 years.  Fecal occult blood test (FOBT) of the stool. You may have this test every year starting at age 55.  Flexible sigmoidoscopy or colonoscopy. You may have a sigmoidoscopy every 5 years or a colonoscopy every 10 years starting at age 55.  Prostate cancer screening. Recommendations will vary depending on your family history and other risks.  Hepatitis C  blood test.  Hepatitis B blood test.  Sexually transmitted disease (STD) testing.  Diabetes screening. This is done by checking your blood sugar (glucose) after you have not eaten for a while (fasting). You may have this done every 1-3 years.  Discuss your test results, treatment options, and if necessary, the need for more tests with your health care provider. Vaccines Your health care provider may recommend certain vaccines, such as:  Influenza vaccine. This is recommended every year.  Tetanus, diphtheria, and acellular pertussis (Tdap, Td) vaccine. You may need a Td booster every 10 years.  Varicella vaccine. You may need this if you have not been vaccinated.  Zoster vaccine. You may need this after age 55.  Measles, mumps, and rubella (MMR) vaccine. You may need at least one dose of MMR if you were born in 1957 or later. You may also need a second dose.  Pneumococcal 13-valent conjugate (PCV13) vaccine. You may need this if you have certain conditions and have not been vaccinated.  Pneumococcal polysaccharide (PPSV23) vaccine. You may need one or two doses if you smoke cigarettes or if you have certain conditions.  Meningococcal vaccine. You may need this if you have certain conditions.  Hepatitis A vaccine. You may need this if you have certain conditions or if you travel or work in places where you may be exposed to hepatitis A.  Hepatitis B vaccine. You may need this if you have certain conditions or if you travel or work in places where you may be exposed to hepatitis B.  Haemophilus influenzae type b (Hib) vaccine.  You may need this if you have certain risk factors.  Talk to your health care provider about which screenings and vaccines you need and how often you need them. This information is not intended to replace advice given to you by your health care provider. Make sure you discuss any questions you have with your health care provider. Document Released: 05/02/2015  Document Revised: 12/24/2015 Document Reviewed: 02/04/2015 Elsevier Interactive Patient Education  2017 Elsevier Inc.  

## 2016-10-30 LAB — COMPREHENSIVE METABOLIC PANEL
ALT: 11 U/L (ref 9–46)
AST: 17 U/L (ref 10–35)
Albumin: 4.3 g/dL (ref 3.6–5.1)
Alkaline Phosphatase: 56 U/L (ref 40–115)
BUN: 11 mg/dL (ref 7–25)
CO2: 24 mmol/L (ref 20–31)
Calcium: 9.3 mg/dL (ref 8.6–10.3)
Chloride: 102 mmol/L (ref 98–110)
Creat: 1.04 mg/dL (ref 0.70–1.33)
Glucose, Bld: 103 mg/dL — ABNORMAL HIGH (ref 65–99)
Potassium: 3.9 mmol/L (ref 3.5–5.3)
Sodium: 139 mmol/L (ref 135–146)
Total Bilirubin: 0.3 mg/dL (ref 0.2–1.2)
Total Protein: 6.8 g/dL (ref 6.1–8.1)

## 2016-10-30 LAB — PSA: PSA: 0.5 ng/mL (ref <=4.0)

## 2016-10-30 LAB — HEPATITIS C ANTIBODY: HCV Ab: NEGATIVE

## 2016-11-01 ENCOUNTER — Encounter: Payer: Self-pay | Admitting: Family Medicine

## 2016-11-01 NOTE — Telephone Encounter (Signed)
Ok to send in 4 of each

## 2016-11-02 ENCOUNTER — Other Ambulatory Visit: Payer: Self-pay | Admitting: Family Medicine

## 2016-11-02 ENCOUNTER — Encounter: Payer: Self-pay | Admitting: Family Medicine

## 2016-11-02 DIAGNOSIS — N529 Male erectile dysfunction, unspecified: Secondary | ICD-10-CM

## 2016-11-02 MED ORDER — SILDENAFIL CITRATE 20 MG PO TABS
20.0000 mg | ORAL_TABLET | Freq: Every day | ORAL | 0 refills | Status: DC
Start: 2016-11-02 — End: 2019-01-08

## 2016-11-02 MED ORDER — VALACYCLOVIR HCL 1 G PO TABS: 1000.0000 mg | ORAL_TABLET | Freq: Every day | ORAL | 0 refills | Status: DC

## 2016-11-02 MED ORDER — SILDENAFIL CITRATE 100 MG PO TABS: 50.0000 mg | ORAL_TABLET | Freq: Every day | ORAL | 0 refills | Status: DC | PRN

## 2016-11-02 NOTE — Telephone Encounter (Signed)
Ok to change to 90 supply 20 mg ---- I believe that is #30

## 2017-01-31 ENCOUNTER — Telehealth: Payer: Self-pay | Admitting: Family Medicine

## 2017-01-31 NOTE — Telephone Encounter (Signed)
Relation to [id number] Call back number:[phone] Pharmacy: [facility] [place] [address], [place], [place] [address]    Reason for call:  Patient states due to insurance change patient requesting 90 day supply loperamide (IMODIUM) 2 MG capsule,please send to

## 2017-01-31 NOTE — Telephone Encounter (Signed)
Ok to send

## 2017-01-31 NOTE — Telephone Encounter (Signed)
Advise if ok to send in

## 2017-02-01 MED ORDER — LOPERAMIDE HCL 2 MG PO CAPS: 4.0000 mg | ORAL_CAPSULE | Freq: Three times a day (TID) | ORAL | 0 refills | Status: DC

## 2017-02-01 NOTE — Telephone Encounter (Signed)
Sent in immodium

## 2017-02-17 ENCOUNTER — Telehealth: Payer: Self-pay | Admitting: Family Medicine

## 2017-02-17 NOTE — Telephone Encounter (Signed)
Relation to pt: self Call back number:[phone]   Reason for call:  Patient requesting TB orders for employment, requesting orders. patient informed next available nurse opening wouldn't be until 03/02/17, patient requesting to be seen by Nurse on Friday,pleaes advise

## 2017-02-17 NOTE — Telephone Encounter (Signed)
Returned patients call advised on message that he would need to call back to see if he can be placed on schedule for PPD and the appointment would have to be double booked because there are no openings for tomorrows schedule. Left message for return call.

## 2017-02-17 NOTE — Telephone Encounter (Signed)
Patient scheduled with nurse for 02/18/17 at [place]. (nurse approved)

## 2017-02-18 ENCOUNTER — Ambulatory Visit (INDEPENDENT_AMBULATORY_CARE_PROVIDER_SITE_OTHER): Payer: BLUE CROSS/BLUE SHIELD

## 2017-02-18 DIAGNOSIS — Z111 Encounter for screening for respiratory tuberculosis: Secondary | ICD-10-CM | POA: Diagnosis not present

## 2017-02-18 NOTE — Progress Notes (Signed)
Pre visit review using our clinic tool,if applicable. No additional management support is needed unless otherwise documented below in the visit note.   Patient in for PPD placement as a requirement for his place of work.  0.1 ml placed in Tuberculin,Purified Protein Derivative placed in Left forearm.  Patient advised to retun on Monday athe the same time of placement to have read. Patient agreed.

## 2017-02-21 ENCOUNTER — Ambulatory Visit: Payer: BLUE CROSS/BLUE SHIELD | Admitting: Behavioral Health

## 2017-02-21 DIAGNOSIS — Z111 Encounter for screening for respiratory tuberculosis: Secondary | ICD-10-CM

## 2017-02-21 LAB — TB SKIN TEST
Induration: 0 mm
TB Skin Test: NEGATIVE

## 2017-02-21 NOTE — Progress Notes (Signed)
Pre visit review using our clinic review tool, if applicable. No additional management support is needed unless otherwise documented below in the visit note.  Patient came in clinic for PPD reading. See clinical documentation note 02/18/17 for placement of TB skin test. The result is negative, 0 mm induration. No chest x-ray was required. A letter was provided to the patient for employment.

## 2017-05-19 ENCOUNTER — Telehealth: Payer: Self-pay | Admitting: *Deleted

## 2017-05-19 MED ORDER — LOPERAMIDE HCL 2 MG PO CAPS: 4.0000 mg | ORAL_CAPSULE | Freq: Three times a day (TID) | ORAL | 0 refills | Status: DC

## 2017-05-20 MED ORDER — LOPERAMIDE HCL 2 MG PO CAPS: 4.0000 mg | ORAL_CAPSULE | Freq: Three times a day (TID) | ORAL | 0 refills | Status: DC

## 2017-05-20 NOTE — Addendum Note (Signed)
Addended by: Kem Boroughs D on: 05/20/2017 11:57 AM   Modules accepted: Orders

## 2017-05-20 NOTE — Telephone Encounter (Signed)
rx resent to [facility]

## 2017-05-20 NOTE — Telephone Encounter (Signed)
Pt needing the meds switched from [facility] [place], [place].

## 2017-08-08 ENCOUNTER — Ambulatory Visit (INDEPENDENT_AMBULATORY_CARE_PROVIDER_SITE_OTHER): Payer: BLUE CROSS/BLUE SHIELD | Admitting: Family Medicine

## 2017-08-08 ENCOUNTER — Encounter: Payer: Self-pay | Admitting: Family Medicine

## 2017-08-08 VITALS — BP 108/62 | HR 62 | Temp 98.4°F | Resp 16 | Ht 70.0 in | Wt 175.8 lb

## 2017-08-08 DIAGNOSIS — J324 Chronic pansinusitis: Secondary | ICD-10-CM | POA: Diagnosis not present

## 2017-08-08 DIAGNOSIS — J40 Bronchitis, not specified as acute or chronic: Secondary | ICD-10-CM

## 2017-08-08 MED ORDER — AMOXICILLIN-POT CLAVULANATE 875-125 MG PO TABS
1.0000 | ORAL_TABLET | Freq: Two times a day (BID) | ORAL | 0 refills | Status: DC
Start: 2017-08-08 — End: 2017-12-16

## 2017-08-08 MED ORDER — PROMETHAZINE-DM 6.25-15 MG/5ML PO SYRP: 5.0000 mL | ORAL_SOLUTION | Freq: Four times a day (QID) | ORAL | 0 refills | Status: DC | PRN

## 2017-08-08 MED ORDER — AMOXICILLIN-POT CLAVULANATE 875-125 MG PO TABS: 1.0000 | ORAL_TABLET | Freq: Two times a day (BID) | ORAL | 0 refills | Status: DC

## 2017-08-08 MED FILL — AMOX-CLAV 875-125 MG TABLET: 875-125 | 10 days supply | Qty: 20 | Fill #0

## 2017-08-08 MED FILL — PROMETHAZINE-DM SYRUP: 6.25-15 | 6 days supply | Qty: 118 | Fill #0

## 2017-08-08 NOTE — Patient Instructions (Signed)

## 2017-08-08 NOTE — Progress Notes (Signed)
Patient ID: Christopher Bell, male    DOB: 1960-04-29  Age: 57 y.o. MRN: [id number]    Subjective:  Subjective  HPI Christopher Bell presents for sinus congestion and cough x 2-3 weeks.  No fever  + sinus headache  + green mucus -- worsening  Pt has tried several otc meds with no relief and is using flonase daily.    Review of Systems  Constitutional: Positive for chills. Negative for fever.  HENT: Positive for congestion, postnasal drip, rhinorrhea, sinus pressure, sinus pain and sneezing.   Respiratory: Positive for cough and shortness of breath. Negative for chest tightness and wheezing.   Cardiovascular: Negative for chest pain, palpitations and leg swelling.  Allergic/Immunologic: Negative for environmental allergies.    History Past Medical History:  Diagnosis Date  . GERD (gastroesophageal reflux disease)     He has no past surgical history on file.   His family history is not on file.He reports that he has never smoked. He has never used smokeless tobacco. He reports that he does not drink alcohol or use drugs.  Current Outpatient Medications on File Prior to Visit  Medication Sig Dispense Refill  . loperamide (IMODIUM) 2 MG capsule Take 2 capsules (4 mg total) by mouth 3 (three) times daily. 540 capsule 0  . nizatidine (AXID) 75 MG tablet Take 1 tablet (75 mg total) by mouth daily. 90 tablet 3  . sildenafil (REVATIO) 20 MG tablet Take 1 tablet (20 mg total) by mouth daily. 90 tablet 0  . sildenafil (VIAGRA) 100 MG tablet Take 0.5-1 tablets (50-100 mg total) by mouth daily as needed for erectile dysfunction. 90 tablet 0  . valACYclovir (VALTREX) 1000 MG tablet Take 1 tablet (1,000 mg total) by mouth daily. 90 tablet 0   No current facility-administered medications on file prior to visit.      Objective:  Objective  Physical Exam  Constitutional: He is oriented to person, place, and time. He appears well-developed and well-nourished.  HENT:  Right Ear: External ear  normal.  Left Ear: External ear normal.  Nose: Mucosal edema and rhinorrhea present. Right sinus exhibits maxillary sinus tenderness and frontal sinus tenderness. Left sinus exhibits frontal sinus tenderness.  + PND + errythema  Eyes: Conjunctivae are normal. Right eye exhibits no discharge. Left eye exhibits no discharge.  Cardiovascular: Normal rate, regular rhythm and normal heart sounds.  No murmur heard. Pulmonary/Chest: Effort normal and breath sounds normal. No respiratory distress. He has no wheezes. He has no rales. He exhibits no tenderness.  Musculoskeletal: He exhibits no edema.  Lymphadenopathy:    He has cervical adenopathy.  Neurological: He is alert and oriented to person, place, and time.  Nursing note and vitals reviewed.  BP 108/62   Pulse 62   Temp 98.4 F (36.9 C) (Oral)   Resp 16   Ht 5\' 10"  (1.778 m)   Wt 175 lb 12.8 oz (79.7 kg)   SpO2 99%   BMI 25.22 kg/m  Wt Readings from Last 3 Encounters:  08/08/17 175 lb 12.8 oz (79.7 kg)  10/29/16 179 lb (81.2 kg)  06/09/15 165 lb (74.8 kg)     Lab Results  Component Value Date   WBC 7.8 10/29/2016   HGB 13.5 10/29/2016   HCT 40.0 10/29/2016   PLT 286 10/29/2016   GLUCOSE 103 (H) 10/29/2016   CHOL 226 (H) 11/04/2014   TRIG 215.0 (H) 11/04/2014   HDL 47.40 11/04/2014   LDLDIRECT 145.0 11/04/2014   ALT 11  10/29/2016   AST 17 10/29/2016   NA 139 10/29/2016   K 3.9 10/29/2016   CL 102 10/29/2016   CREATININE 1.04 10/29/2016   BUN 11 10/29/2016   CO2 24 10/29/2016   TSH 1.35 10/29/2016   PSA 0.5 10/29/2016    No results found.   Assessment & Plan:  Plan  I am having [name] start on promethazine-dextromethorphan. I am also having him maintain his nizatidine, valACYclovir, sildenafil, sildenafil, loperamide, and amoxicillin-clavulanate.  Meds ordered this encounter  Medications  . DISCONTD: amoxicillin-clavulanate (AUGMENTIN) 875-125 MG tablet    Sig: Take 1 tablet by mouth 2 (two) times  daily.    Dispense:  20 tablet    Refill:  0  . amoxicillin-clavulanate (AUGMENTIN) 875-125 MG tablet    Sig: Take 1 tablet by mouth 2 (two) times daily.    Dispense:  20 tablet    Refill:  0  . promethazine-dextromethorphan (PROMETHAZINE-DM) 6.25-15 MG/5ML syrup    Sig: Take 5 mLs by mouth 4 (four) times daily as needed.    Dispense:  118 mL    Refill:  0    Problem List Items Addressed This Visit    None    Visit Diagnoses    Pansinusitis, unspecified chronicity    -  Primary   Relevant Medications   amoxicillin-clavulanate (AUGMENTIN) 875-125 MG tablet   promethazine-dextromethorphan (PROMETHAZINE-DM) 6.25-15 MG/5ML syrup   Bronchitis       Relevant Medications   promethazine-dextromethorphan (PROMETHAZINE-DM) 6.25-15 MG/5ML syrup    con't flonase and antihistamine Call or rto prn   Follow-up: Return if symptoms worsen or fail to improve.  Ann Held, DO

## 2017-08-08 NOTE — Progress Notes (Deleted)
Patient ID: Christopher Bell, male    DOB: April 24, 1960  Age: 57 y.o. MRN: [id number]    Subjective:  Subjective  HPI Christopher Bell presents for sinus congestion and cough  Review of Systems  History Past Medical History:  Diagnosis Date  . GERD (gastroesophageal reflux disease)     He has no past surgical history on file.   His family history is not on file.He reports that he has never smoked. He has never used smokeless tobacco. He reports that he does not drink alcohol or use drugs.  Current Outpatient Medications on File Prior to Visit  Medication Sig Dispense Refill  . loperamide (IMODIUM) 2 MG capsule Take 2 capsules (4 mg total) by mouth 3 (three) times daily. 540 capsule 0  . nizatidine (AXID) 75 MG tablet Take 1 tablet (75 mg total) by mouth daily. 90 tablet 3  . sildenafil (REVATIO) 20 MG tablet Take 1 tablet (20 mg total) by mouth daily. 90 tablet 0  . sildenafil (VIAGRA) 100 MG tablet Take 0.5-1 tablets (50-100 mg total) by mouth daily as needed for erectile dysfunction. 90 tablet 0  . valACYclovir (VALTREX) 1000 MG tablet Take 1 tablet (1,000 mg total) by mouth daily. 90 tablet 0   No current facility-administered medications on file prior to visit.      Objective:  Objective  Physical Exam BP 108/62   Pulse 62   Temp 98.4 F (36.9 C) (Oral)   Resp 16   Ht 5\' 10"  (1.778 m)   Wt 175 lb 12.8 oz (79.7 kg)   SpO2 99%   BMI 25.22 kg/m  Wt Readings from Last 3 Encounters:  08/08/17 175 lb 12.8 oz (79.7 kg)  10/29/16 179 lb (81.2 kg)  06/09/15 165 lb (74.8 kg)     Lab Results  Component Value Date   WBC 7.8 10/29/2016   HGB 13.5 10/29/2016   HCT 40.0 10/29/2016   PLT 286 10/29/2016   GLUCOSE 103 (H) 10/29/2016   CHOL 226 (H) 11/04/2014   TRIG 215.0 (H) 11/04/2014   HDL 47.40 11/04/2014   LDLDIRECT 145.0 11/04/2014   ALT 11 10/29/2016   AST 17 10/29/2016   NA 139 10/29/2016   K 3.9 10/29/2016   CL 102 10/29/2016   CREATININE 1.04 10/29/2016   BUN  11 10/29/2016   CO2 24 10/29/2016   TSH 1.35 10/29/2016   PSA 0.5 10/29/2016    No results found.   Assessment & Plan:  Plan  I am having Christopher Bell start on promethazine-dextromethorphan. I am also having him maintain his nizatidine, valACYclovir, sildenafil, sildenafil, loperamide, and amoxicillin-clavulanate.  Meds ordered this encounter  Medications  . DISCONTD: amoxicillin-clavulanate (AUGMENTIN) 875-125 MG tablet    Sig: Take 1 tablet by mouth 2 (two) times daily.    Dispense:  20 tablet    Refill:  0  . amoxicillin-clavulanate (AUGMENTIN) 875-125 MG tablet    Sig: Take 1 tablet by mouth 2 (two) times daily.    Dispense:  20 tablet    Refill:  0  . promethazine-dextromethorphan (PROMETHAZINE-DM) 6.25-15 MG/5ML syrup    Sig: Take 5 mLs by mouth 4 (four) times daily as needed.    Dispense:  118 mL    Refill:  0    Problem List Items Addressed This Visit    None    Visit Diagnoses    Pansinusitis, unspecified chronicity    -  Primary   Relevant Medications   amoxicillin-clavulanate (AUGMENTIN) 875-125 MG  tablet   promethazine-dextromethorphan (PROMETHAZINE-DM) 6.25-15 MG/5ML syrup   Bronchitis       Relevant Medications   promethazine-dextromethorphan (PROMETHAZINE-DM) 6.25-15 MG/5ML syrup      Follow-up: No follow-ups on file.  Ann Held, DO

## 2017-08-23 ENCOUNTER — Other Ambulatory Visit: Payer: Self-pay | Admitting: Family Medicine

## 2017-08-23 NOTE — Telephone Encounter (Unsigned)
Copied from [place]. Topic: Quick Communication - Rx Refill/Question >> Aug 23, 2017  5:18 PM Percell Belt A wrote: Medication: pantoprazole- looks like this was discontinued- [facility]   loperamide (IMODIUM) 2 MG capsule [[id number]]- [facility]  Has the patient contacted their pharmacy? no (Agent: If no, request that the patient contact the pharmacy for the refill.) Preferred Pharmacy (with phone number or street name): [facility]: Please be advised that RX refills may take up to 3 business days. We ask that you follow-up with your pharmacy.

## 2017-08-24 NOTE — Telephone Encounter (Signed)
Patient called and advised the protonix was switched to nizatidine in 2018. He says "go ahead and put the request in for it to go to [facility] and Imodium to [facility]."  Nizatidine refill; Imodium refill Last OV:10/29/16 Last refilled: Nizatidine in 2016 and Imodium 05/20/17  540 cap/0 refills [id number] Pharmacy for Imodium: [facility] # [address] [place] [phone] (Phone) [phone] (Fax)   Pharmacy for Nizatidine: [facility].[place], [place] [phone] (Phone) [phone] (Fax)

## 2017-08-24 NOTE — Telephone Encounter (Signed)
Left message to call back- verify Rx needed and pharmacy.

## 2017-08-26 MED ORDER — LOPERAMIDE HCL 2 MG PO CAPS: 4.0000 mg | ORAL_CAPSULE | Freq: Three times a day (TID) | ORAL | 1 refills | Status: DC

## 2017-08-30 ENCOUNTER — Other Ambulatory Visit: Payer: Self-pay | Admitting: *Deleted

## 2017-08-30 MED ORDER — PANTOPRAZOLE SODIUM 40 MG PO TBEC: 40.0000 mg | DELAYED_RELEASE_TABLET | Freq: Every day | ORAL | 1 refills | Status: DC

## 2017-12-16 ENCOUNTER — Ambulatory Visit (INDEPENDENT_AMBULATORY_CARE_PROVIDER_SITE_OTHER): Payer: BLUE CROSS/BLUE SHIELD | Admitting: Family Medicine

## 2017-12-16 ENCOUNTER — Encounter: Payer: Self-pay | Admitting: Family Medicine

## 2017-12-16 VITALS — BP 120/78 | HR 84 | Temp 98.6°F | Ht 70.0 in | Wt 174.1 lb

## 2017-12-16 DIAGNOSIS — J029 Acute pharyngitis, unspecified: Secondary | ICD-10-CM | POA: Diagnosis not present

## 2017-12-16 LAB — POCT RAPID STREP A (OFFICE): Rapid Strep A Screen: NEGATIVE

## 2017-12-16 MED ORDER — AMOXICILLIN 500 MG PO CAPS: 1000.0000 mg | ORAL_CAPSULE | Freq: Every day | ORAL | 0 refills | Status: DC

## 2017-12-16 MED FILL — AMOXICILLIN 500 MG CAPSULE: 500 | 10 days supply | Qty: 20 | Fill #0

## 2017-12-16 NOTE — Progress Notes (Signed)
Pre visit review using our clinic review tool, if applicable. No additional management support is needed unless otherwise documented below in the visit note. 

## 2017-12-16 NOTE — Patient Instructions (Signed)
Ibuprofen 400-600 mg (2-3 over the counter strength tabs) every 6 hours as needed for pain.  OK to take Tylenol 1000 mg (2 extra strength tabs) or 975 mg (3 regular strength tabs) every 6 hours as needed.  Salt water gargles, air humidifiers, throat lozenges.  2 days to get the results of your culture back.  Let us know if you need anything.

## 2017-12-16 NOTE — Progress Notes (Signed)
SUBJECTIVE:   Christopher Bell is a 57 y.o. male presents to the clinic for:  Chief Complaint  Patient presents with  . Sore Throat  . Fever    Complains of sore throat for 1 week.  Other associated symptoms: sore throat, fevers (100F), and myalgia.  Denies: sinus congestion, sinus pain, rhinorrhea, itchy watery eyes, ear pain, ear drainage, wheezing, shortness of breath and cough Sick Contacts: none known Therapy to date: Tylenol, Sudafed  Social History   Tobacco Use  Smoking Status Never Smoker  Smokeless Tobacco Never Used    ROS: Pertinent items are noted in HPI  Patient's medications, allergies, past medical, surgical, social and family histories were reviewed and updated as appropriate.  OBJECTIVE:  BP 120/78 (BP Location: Left Arm, Patient Position: Sitting, Cuff Size: Normal)   Pulse 84   Temp 98.6 F (37 C) (Oral)   Ht 5\' 10"  (1.778 m)   Wt 174 lb 2 oz (79 kg)   SpO2 94%   BMI 24.98 kg/m  General: Awake, alert, appearing stated age Eyes: conjunctivae and sclerae clear Ears: normal TMs bilaterally Nose: no visible exudate Oropharynx: lips, mucosa, and tongue normal; teeth and gums normal and pharynx mildly erythematous, no exudates Neck: supple, no significant adenopathy Lungs: clear to auscultation, no wheezes, rales or rhonchi, symmetric air entry, normal effort Heart: rate and rhythm regular Skin:reveals no rash Psych: Age appropriate judgment and insight  ASSESSMENT/PLAN:  Sore throat - Plan: amoxicillin (AMOXIL) 500 MG capsule, POCT rapid strep A  Strep test neg. Empirically start amox 1 g/d for 10 d. Send MyChart message based off of results of cx.  Continue to practice good hand hygiene and push fluids. Ibuprofen and acetaminophen for pain. F/u prn. Pt voiced understanding and agreement to the plan.  [place], DO 12/16/17 3:10 PM

## 2018-02-13 ENCOUNTER — Ambulatory Visit: Payer: BLUE CROSS/BLUE SHIELD

## 2018-02-14 ENCOUNTER — Ambulatory Visit (INDEPENDENT_AMBULATORY_CARE_PROVIDER_SITE_OTHER): Payer: BLUE CROSS/BLUE SHIELD

## 2018-02-14 DIAGNOSIS — Z111 Encounter for screening for respiratory tuberculosis: Secondary | ICD-10-CM | POA: Diagnosis not present

## 2018-02-14 DIAGNOSIS — Z23 Encounter for immunization: Secondary | ICD-10-CM | POA: Diagnosis not present

## 2018-02-14 NOTE — Addendum Note (Signed)
Addended by: Vernie Shanks E on: 02/14/2018 10:03 AM   Modules accepted: Orders

## 2018-02-16 ENCOUNTER — Ambulatory Visit: Payer: BLUE CROSS/BLUE SHIELD

## 2018-02-16 DIAGNOSIS — Z111 Encounter for screening for respiratory tuberculosis: Secondary | ICD-10-CM

## 2018-02-16 LAB — TB SKIN TEST
Induration: 0 mm
TB Skin Test: NEGATIVE

## 2018-02-17 ENCOUNTER — Telehealth: Payer: Self-pay | Admitting: Family Medicine

## 2018-02-17 NOTE — Telephone Encounter (Signed)
Copied from [place] [id number]. Topic: Quick Communication - Rx Refill/Question >> Feb 17, 2018 11:04 AM Judyann Munson wrote: Medication: pantoprazole (PROTONIX) 40 MG tablet   Has the patient contacted their pharmacy? Yes   Preferred Pharmacy (with phone number or street name): [facility].[place], [place] [phone] (Phone) [phone] (Fax)    Agent: Please be advised that RX refills may take up to 3 business days. We ask that you follow-up with your pharmacy.

## 2018-02-21 ENCOUNTER — Other Ambulatory Visit: Payer: Self-pay | Admitting: Family Medicine

## 2018-02-21 DIAGNOSIS — K219 Gastro-esophageal reflux disease without esophagitis: Secondary | ICD-10-CM

## 2018-02-21 MED ORDER — PANTOPRAZOLE SODIUM 40 MG PO TBEC: 40.0000 mg | DELAYED_RELEASE_TABLET | Freq: Every day | ORAL | 3 refills | Status: DC

## 2018-02-21 NOTE — Telephone Encounter (Signed)
Sent in

## 2018-02-21 NOTE — Telephone Encounter (Signed)
Pt requesting refill on pantoprazole 40mg - no longer on med list. Is this okay?

## 2018-04-20 ENCOUNTER — Ambulatory Visit (INDEPENDENT_AMBULATORY_CARE_PROVIDER_SITE_OTHER): Payer: BLUE CROSS/BLUE SHIELD | Admitting: Family Medicine

## 2018-04-20 ENCOUNTER — Encounter: Payer: Self-pay | Admitting: Family Medicine

## 2018-04-20 VITALS — BP 108/72 | HR 70 | Temp 98.0°F | Ht 70.0 in | Wt 179.0 lb

## 2018-04-20 DIAGNOSIS — J069 Acute upper respiratory infection, unspecified: Secondary | ICD-10-CM | POA: Diagnosis not present

## 2018-04-20 MED ORDER — AZITHROMYCIN 250 MG PO TABS: ORAL_TABLET | ORAL | 0 refills | Status: DC

## 2018-04-20 NOTE — Progress Notes (Signed)
Christopher Bell - 58 y.o. male MRN [id number]  Date of birth: [birth year]  SUBJECTIVE:  Including CC & ROS.  Chief Complaint  Patient presents with  . head congestion    started as a cold, no fever.   . Sore Throat    Christopher Bell is a 58 y.o. male that is presenting with congestion, rhinorrhea and cough for about a week.  He feels like his symptoms are getting worse.  He travels for work.  Denies any measured temperature fever.  Is going on a cruise soon.    Review of Systems  Constitutional: Negative for fever.  HENT: Positive for congestion.   Respiratory: Positive for cough.   Cardiovascular: Negative for chest pain.  Gastrointestinal: Negative for abdominal pain.    HISTORY: Past Medical, Surgical, Social, and Family History Reviewed & Updated per EMR.   Pertinent Historical Findings include:  Past Medical History:  Diagnosis Date  . GERD (gastroesophageal reflux disease)     History reviewed. No pertinent surgical history.  No Known Allergies  History reviewed. No pertinent family history.   Social History   Socioeconomic History  . Marital status: Married    Spouse name: Not on file  . Number of children: Not on file  . Years of education: Not on file  . Highest education level: Not on file  Occupational History  . Occupation: Tree surgeon  Social Needs  . Financial resource strain: Not on file  . Food insecurity:    Worry: Not on file    Inability: Not on file  . Transportation needs:    Medical: Not on file    Non-medical: Not on file  Tobacco Use  . Smoking status: Never Smoker  . Smokeless tobacco: Never Used  Substance and Sexual Activity  . Alcohol use: No  . Drug use: No  . Sexual activity: Not on file  Lifestyle  . Physical activity:    Days per week: Not on file    Minutes per session: Not on file  . Stress: Not on file  Relationships  . Social connections:    Talks on phone: Not on file    Gets together: Not on file    Attends  religious service: Not on file    Active member of club or organization: Not on file    Attends meetings of clubs or organizations: Not on file    Relationship status: Not on file  . Intimate partner violence:    Fear of current or ex partner: Not on file    Emotionally abused: Not on file    Physically abused: Not on file    Forced sexual activity: Not on file  Other Topics Concern  . Not on file  Social History Narrative   Exercise-no-walks a lot at work        PHYSICAL EXAM:  VS: BP 108/72   Pulse 70   Temp 98 F (36.7 C) (Oral)   Ht 5\' 10"  (1.778 m)   Wt 179 lb (81.2 kg)   SpO2 97%   BMI 25.68 kg/m  Physical Exam Gen: NAD, alert, cooperative with exam,  ENT: normal lips, normal nasal mucosa, tympanic membranes clear and intact bilaterally, normal oropharynx, no cervical lymphadenopathy Eye: normal EOM, normal conjunctiva and lids CV:  no edema, +2 pedal pulses, regular rate and rhythm, S1-S2   Resp: no accessory muscle use, non-labored, clear to auscultation bilaterally, no crackles or wheezes Skin: no rashes, no areas of induration  Neuro:  normal tone, normal sensation to touch Psych:  normal insight, alert and oriented MSK: Normal gait, normal strength      ASSESSMENT & PLAN:   Upper respiratory tract infection Likely viral in nature. -Provide azithromycin if no improvement. -Counseled on supportive care. -Given occasions return and follow-up.

## 2018-04-20 NOTE — Patient Instructions (Signed)
Nice to meet you  Please try things such as zyrtec-D or allegra-D which is an antihistamine and decongestant.  Please try afrin which will help with nasal congestion but use for only three days.  Please also try using a netti pot on a regular occasion. Please try honey, vick's vapor rub, lozenges and humidifer for cough and sore throat

## 2018-04-20 NOTE — Assessment & Plan Note (Signed)
Likely viral in nature. -Provide azithromycin if no improvement. -Counseled on supportive care. -Given occasions return and follow-up.

## 2018-04-21 ENCOUNTER — Telehealth: Payer: Self-pay | Admitting: Family Medicine

## 2018-04-21 ENCOUNTER — Other Ambulatory Visit: Payer: Self-pay | Admitting: Family Medicine

## 2018-04-21 NOTE — Telephone Encounter (Signed)
Copied from [place] [phone]. Topic: Quick Communication - Rx Refill/Question >> Apr 21, 2018 11:56 AM Blase Mess A wrote: Medication: loperamide (IMODIUM) 2 MG capsule [[id number]] , 90 day supply   Has the patient contacted their pharmacy? yes (Agent: If no, request that the patient contact the pharmacy for the refill.) (Agent: If yes, when and what did the pharmacy advise?)  Preferred Pharmacy (with phone number or street name): [facility] # [address] [place] [phone] (Phone) [phone] (Fax)    Agent: Please be advised that RX refills may take up to 3 business days. We ask that you follow-up with your pharmacy.

## 2018-04-25 NOTE — Telephone Encounter (Signed)
Medication sent on 04/21/18

## 2018-06-12 ENCOUNTER — Encounter: Payer: BLUE CROSS/BLUE SHIELD | Admitting: Family Medicine

## 2018-07-17 ENCOUNTER — Ambulatory Visit: Payer: BLUE CROSS/BLUE SHIELD | Admitting: Family Medicine

## 2018-07-17 ENCOUNTER — Other Ambulatory Visit: Payer: Self-pay | Admitting: Family Medicine

## 2018-07-17 ENCOUNTER — Other Ambulatory Visit: Payer: Self-pay | Admitting: *Deleted

## 2018-07-17 ENCOUNTER — Encounter

## 2018-07-17 ENCOUNTER — Encounter: Payer: BLUE CROSS/BLUE SHIELD | Admitting: Family Medicine

## 2018-07-17 ENCOUNTER — Telehealth: Payer: Self-pay | Admitting: *Deleted

## 2018-07-17 ENCOUNTER — Other Ambulatory Visit: Payer: Self-pay

## 2018-07-17 DIAGNOSIS — K219 Gastro-esophageal reflux disease without esophagitis: Secondary | ICD-10-CM

## 2018-07-17 MED ORDER — PANTOPRAZOLE SODIUM 40 MG PO TBEC: 40.0000 mg | DELAYED_RELEASE_TABLET | Freq: Every day | ORAL | 3 refills | Status: DC

## 2018-07-17 NOTE — Telephone Encounter (Signed)
Copied from [place] [id number]. Topic: Appointment Scheduling - Scheduling Inquiry for Clinic >> Jul 17, 2018  8:20 AM Scherrie Gerlach wrote: Reason for CRM: pt states he never got link for his web ex appt this am with Dr Etter Sjogren at 10:30 am Would like you to resend to email

## 2018-07-18 ENCOUNTER — Encounter: Payer: Self-pay | Admitting: Family Medicine

## 2018-07-18 MED ORDER — LOPERAMIDE HCL 2 MG PO TABS: 2.0000 | ORAL_TABLET | Freq: Three times a day (TID) | ORAL | 0 refills | Status: DC

## 2018-07-18 NOTE — Telephone Encounter (Signed)
Insurance does not cover protonix.  We can do a PA or send in alternative which is omeprazole.  It looks like patient would like to go back on Axid.

## 2018-07-18 NOTE — Telephone Encounter (Signed)
Why is protonix not available?

## 2018-07-18 NOTE — Telephone Encounter (Signed)
Insurance does not cover protonix.  We can do a PA or send in alternative which is omeprazole.

## 2018-07-19 ENCOUNTER — Other Ambulatory Visit: Payer: Self-pay | Admitting: Family Medicine

## 2018-07-19 DIAGNOSIS — K219 Gastro-esophageal reflux disease without esophagitis: Secondary | ICD-10-CM

## 2018-07-19 MED ORDER — NIZATIDINE 150 MG PO CAPS: 150.0000 mg | ORAL_CAPSULE | Freq: Two times a day (BID) | ORAL | 3 refills | Status: DC

## 2018-07-19 NOTE — Telephone Encounter (Signed)
Insurance does not cover protonix.  We can do a PA or send in alternative which is omeprazole.  It looks like patient would like to go back on Axid.

## 2018-07-19 NOTE — Telephone Encounter (Signed)
I sent axid to [facility]--- have not written that in years so not sure it will be covered

## 2018-09-22 ENCOUNTER — Encounter: Payer: BLUE CROSS/BLUE SHIELD | Admitting: Family Medicine

## 2018-11-22 ENCOUNTER — Other Ambulatory Visit: Payer: Self-pay | Admitting: Family Medicine

## 2018-11-22 MED ORDER — LOPERAMIDE HCL 2 MG PO TABS: 2.0000 | ORAL_TABLET | Freq: Three times a day (TID) | ORAL | 0 refills | Status: DC

## 2018-11-22 NOTE — Telephone Encounter (Signed)
Rx sent 

## 2018-11-22 NOTE — Telephone Encounter (Signed)
Medication Refill - Medication: loperamide (IMODIUM A-D) 2 MG tablet Pt would like a renewal/refill for the year. /  Please advise   Has the patient contacted their pharmacy? Yes.   (Agent: If no, request that the patient contact the pharmacy for the refill.) (Agent: If yes, when and what did the pharmacy advise?)  Preferred Pharmacy (with phone number or street name):  [facility] # [address] [place] [phone] (Phone) [phone] (Fax)     Agent: Please be advised that RX refills may take up to 3 business days. We ask that you follow-up with your pharmacy.

## 2019-01-04 ENCOUNTER — Telehealth: Payer: Self-pay

## 2019-01-04 NOTE — Telephone Encounter (Signed)
Pt called back in at 4:45 on earlier call/ Informed him that TB skin test had to be ordered and is requesting Dr L place an order as is yearly req for work. He also will sch a flu shot at that time. Please place order and pt will call back in for sch

## 2019-01-04 NOTE — Telephone Encounter (Signed)
Copied from [place] [id number]. Topic: Appointment Scheduling - Scheduling Inquiry for Clinic >> Jan 04, 2019  9:08 AM Burchel, Abbi R wrote: Reason for CRM: Pt states his wife is coming in tomorrow at 9:45 for a flu shot and pt would like to come in at the same time to get flu shot and TB skin test.  Please advise if this is okay.  Pt: [phone]

## 2019-01-08 ENCOUNTER — Encounter: Payer: Self-pay | Admitting: Family Medicine

## 2019-01-08 ENCOUNTER — Other Ambulatory Visit: Payer: Self-pay

## 2019-01-08 ENCOUNTER — Ambulatory Visit (INDEPENDENT_AMBULATORY_CARE_PROVIDER_SITE_OTHER): Payer: BC Managed Care – PPO | Admitting: Family Medicine

## 2019-01-08 VITALS — BP 100/70 | HR 75 | Temp 98.2°F | Resp 18 | Ht 70.0 in | Wt 172.8 lb

## 2019-01-08 DIAGNOSIS — Z111 Encounter for screening for respiratory tuberculosis: Secondary | ICD-10-CM | POA: Diagnosis not present

## 2019-01-08 DIAGNOSIS — K2270 Barrett's esophagus without dysplasia: Secondary | ICD-10-CM

## 2019-01-08 DIAGNOSIS — Z23 Encounter for immunization: Secondary | ICD-10-CM

## 2019-01-08 DIAGNOSIS — K219 Gastro-esophageal reflux disease without esophagitis: Secondary | ICD-10-CM

## 2019-01-08 MED ORDER — NIZATIDINE 150 MG PO CAPS: 150.0000 mg | ORAL_CAPSULE | Freq: Two times a day (BID) | ORAL | 3 refills | Status: DC

## 2019-01-08 MED ORDER — LOPERAMIDE HCL 2 MG PO TABS: 2.0000 | ORAL_TABLET | Freq: Three times a day (TID) | ORAL | 3 refills | Status: DC

## 2019-01-08 NOTE — Patient Instructions (Signed)

## 2019-01-08 NOTE — Progress Notes (Signed)
Patient ID: Christopher Bell, male    DOB: 05/30/1960  Age: 58 y.o. MRN: [id number]    Subjective:  Subjective  HPI OLIS SWANNER presents for f/u , refills and he needs TB and flu shot for work Pt has no complaints   Review of Systems  Constitutional: Negative for appetite change, diaphoresis, fatigue and unexpected weight change.  Eyes: Negative for pain, redness and visual disturbance.  Respiratory: Negative for cough, chest tightness, shortness of breath and wheezing.   Cardiovascular: Negative for chest pain, palpitations and leg swelling.  Endocrine: Negative for cold intolerance, heat intolerance, polydipsia, polyphagia and polyuria.  Genitourinary: Negative for difficulty urinating, dysuria and frequency.  Neurological: Negative for dizziness, light-headedness, numbness and headaches.    History Past Medical History:  Diagnosis Date  . GERD (gastroesophageal reflux disease)     He has no past surgical history on file.   His family history is not on file.He reports that he has never smoked. He has never used smokeless tobacco. He reports that he does not drink alcohol or use drugs.  Current Outpatient Medications on File Prior to Visit  Medication Sig Dispense Refill  . pantoprazole (PROTONIX) 40 MG tablet Take 1 tablet (40 mg total) by mouth daily. 90 tablet 3  . valACYclovir (VALTREX) 1000 MG tablet Take 1 tablet (1,000 mg total) by mouth daily. 90 tablet 0  . azithromycin (ZITHROMAX) 250 MG tablet Take 500 mg the first day then 250 mg the next 4 days. Total of 5 days. (Patient not taking: Reported on 01/08/2019) 6 tablet 0   No current facility-administered medications on file prior to visit.      Objective:  Objective  Physical Exam Vitals signs and nursing note reviewed.  Constitutional:      General: He is sleeping.     Appearance: He is well-developed.  HENT:     Head: Normocephalic and atraumatic.  Eyes:     Pupils: Pupils are equal, round, and reactive  to light.  Neck:     Musculoskeletal: Normal range of motion and neck supple.     Thyroid: No thyromegaly.  Cardiovascular:     Rate and Rhythm: Normal rate and regular rhythm.     Heart sounds: No murmur.  Pulmonary:     Effort: Pulmonary effort is normal. No respiratory distress.     Breath sounds: Normal breath sounds. No wheezing or rales.  Chest:     Chest wall: No tenderness.  Musculoskeletal:        General: No tenderness.  Skin:    General: Skin is warm and dry.  Neurological:     Mental Status: He is oriented to person, place, and time.  Psychiatric:        Behavior: Behavior normal.        Thought Content: Thought content normal.        Judgment: Judgment normal.    BP 100/70 (BP Location: Right Arm, Patient Position: Sitting, Cuff Size: Normal)   Pulse 75   Temp 98.2 F (36.8 C) (Temporal)   Resp 18   Ht 5\' 10"  (1.778 m)   Wt 172 lb 12.8 oz (78.4 kg)   SpO2 97%   BMI 24.79 kg/m  Wt Readings from Last 3 Encounters:  01/08/19 172 lb 12.8 oz (78.4 kg)  04/20/18 179 lb (81.2 kg)  12/16/17 174 lb 2 oz (79 kg)     Lab Results  Component Value Date   WBC 7.8 10/29/2016   HGB 13.5  10/29/2016   HCT 40.0 10/29/2016   PLT 286 10/29/2016   GLUCOSE 103 (H) 10/29/2016   CHOL 226 (H) 11/04/2014   TRIG 215.0 (H) 11/04/2014   HDL 47.40 11/04/2014   LDLDIRECT 145.0 11/04/2014   ALT 11 10/29/2016   AST 17 10/29/2016   NA 139 10/29/2016   K 3.9 10/29/2016   CL 102 10/29/2016   CREATININE 1.04 10/29/2016   BUN 11 10/29/2016   CO2 24 10/29/2016   TSH 1.35 10/29/2016   PSA 0.5 10/29/2016    No results found.   Assessment & Plan:  Plan  I have discontinued Sriyan Terp. Thom's sildenafil and sildenafil. I am also having him maintain his valACYclovir, azithromycin, pantoprazole, loperamide, and nizatidine.  Meds ordered this encounter  Medications  . loperamide (IMODIUM A-D) 2 MG tablet    Sig: Take 2 tablets (4 mg total) by mouth 3 (three) times daily.     Dispense:  540 tablet    Refill:  3  . nizatidine (AXID) 150 MG capsule    Sig: Take 1 capsule (150 mg total) by mouth 2 (two) times daily.    Dispense:  180 capsule    Refill:  3    Problem List Items Addressed This Visit      Unprioritized   BARRETTS ESOPHAGUS    Stable meds refilled       Other Visit Diagnoses    Need for influenza vaccination    -  Primary   Relevant Orders   Flu Vaccine QUAD 6+ mos PF IM (Fluarix Quad PF) (Completed)   Gastroesophageal reflux disease, esophagitis presence not specified       Relevant Medications   loperamide (IMODIUM A-D) 2 MG tablet   nizatidine (AXID) 150 MG capsule   Visit for TB skin test       Need for tuberculosis vaccination       Relevant Orders   QuantiFERON-TB Gold Plus      Follow-up: Return if symptoms worsen or fail to improve.  Ann Held, DO

## 2019-01-09 ENCOUNTER — Ambulatory Visit: Payer: BLUE CROSS/BLUE SHIELD | Admitting: Family Medicine

## 2019-01-09 NOTE — Assessment & Plan Note (Signed)
Stable   meds refilled

## 2019-01-10 ENCOUNTER — Telehealth: Payer: Self-pay | Admitting: *Deleted

## 2019-01-10 LAB — QUANTIFERON-TB GOLD PLUS
Mitogen-NIL: >10 IU/mL
NIL: 0.02 IU/mL
QuantiFERON-TB Gold Plus: NEGATIVE
TB1-NIL: 0 IU/mL
TB2-NIL: 0 IU/mL

## 2019-01-10 NOTE — Telephone Encounter (Signed)
PA sent 01/09/19 on nizantidine through covermymeds.  Key: [id number].  Awaiting approval.

## 2019-01-11 ENCOUNTER — Ambulatory Visit: Payer: BLUE CROSS/BLUE SHIELD | Admitting: Family Medicine

## 2019-01-11 ENCOUNTER — Encounter: Payer: Self-pay | Admitting: *Deleted

## 2019-01-11 NOTE — Telephone Encounter (Signed)
Patient stated that the pharmacy just filled his medication too early.  Advised that next refill may not be covered.  He stated he think it was just too early.    Patient also wanted to know his lab results.  Lab results given.  He also needed a letter with documentation that he got his flu vaccine and that his tb test was negative.  Letter sent to [facility] for patient to printed out.

## 2019-03-30 ENCOUNTER — Other Ambulatory Visit: Payer: Self-pay | Admitting: Family Medicine

## 2019-03-30 DIAGNOSIS — K219 Gastro-esophageal reflux disease without esophagitis: Secondary | ICD-10-CM

## 2019-05-28 ENCOUNTER — Encounter: Payer: Self-pay | Admitting: Family Medicine

## 2019-08-30 ENCOUNTER — Telehealth: Payer: Self-pay

## 2019-08-30 NOTE — Telephone Encounter (Signed)
Patient needs the nurse or DR. Lowne to give him a call to discuss why he received a 30 day supply rather then a 90 day supply that he gets on a normal basses for   pantoprazole (PROTONIX) 40 MG tablet [id number]   This 30 day supply was sent to the   [facility] # [place], [place]  [address] [place] [address]  Phone:  [phone] Fax:  [phone]  DEA #:  --   The patient would like a 90 day supply Please follow up with the patient at [phone]

## 2019-08-31 NOTE — Telephone Encounter (Signed)
Left VM. Advised patient to call back to set up a appointment with Lowne.

## 2019-09-20 ENCOUNTER — Ambulatory Visit (INDEPENDENT_AMBULATORY_CARE_PROVIDER_SITE_OTHER): Payer: BC Managed Care – PPO | Admitting: Family Medicine

## 2019-09-20 ENCOUNTER — Encounter: Payer: Self-pay | Admitting: Family Medicine

## 2019-09-20 ENCOUNTER — Other Ambulatory Visit: Payer: Self-pay

## 2019-09-20 VITALS — BP 104/60 | HR 68 | Temp 97.7°F | Resp 18 | Ht 70.0 in | Wt 182.0 lb

## 2019-09-20 DIAGNOSIS — K580 Irritable bowel syndrome with diarrhea: Secondary | ICD-10-CM

## 2019-09-20 DIAGNOSIS — A6000 Herpesviral infection of urogenital system, unspecified: Secondary | ICD-10-CM | POA: Diagnosis not present

## 2019-09-20 DIAGNOSIS — K219 Gastro-esophageal reflux disease without esophagitis: Secondary | ICD-10-CM | POA: Diagnosis not present

## 2019-09-20 DIAGNOSIS — Z125 Encounter for screening for malignant neoplasm of prostate: Secondary | ICD-10-CM | POA: Diagnosis not present

## 2019-09-20 DIAGNOSIS — Z0000 Encounter for general adult medical examination without abnormal findings: Secondary | ICD-10-CM | POA: Diagnosis not present

## 2019-09-20 DIAGNOSIS — K22719 Barrett's esophagus with dysplasia, unspecified: Secondary | ICD-10-CM

## 2019-09-20 DIAGNOSIS — K589 Irritable bowel syndrome without diarrhea: Secondary | ICD-10-CM | POA: Insufficient documentation

## 2019-09-20 MED ORDER — PANTOPRAZOLE SODIUM 40 MG PO TBEC: 40.0000 mg | DELAYED_RELEASE_TABLET | Freq: Every day | ORAL | 3 refills | Status: DC

## 2019-09-20 MED ORDER — LOPERAMIDE HCL 2 MG PO TABS: 2.0000 | ORAL_TABLET | Freq: Three times a day (TID) | ORAL | 3 refills | Status: DC

## 2019-09-20 MED ORDER — VALACYCLOVIR HCL 1 G PO TABS: 1000.0000 mg | ORAL_TABLET | Freq: Every day | ORAL | 3 refills | Status: DC

## 2019-09-20 NOTE — Assessment & Plan Note (Signed)
Per gi--[facility] con't immodium

## 2019-09-20 NOTE — Assessment & Plan Note (Signed)
Per gi con't protonix

## 2019-09-20 NOTE — Patient Instructions (Signed)
Preventive Care 41-58 Years Old, Male Preventive care refers to lifestyle choices and visits with your health care provider that can promote health and wellness. This includes:  A yearly physical exam. This is also called an annual well check.  Regular dental and eye exams.  Immunizations.  Screening for certain conditions.  Healthy lifestyle choices, such as eating a healthy diet, getting regular exercise, not using drugs or products that contain nicotine and tobacco, and limiting alcohol use. What can I expect for my preventive care visit? Physical exam Your health care provider will check:  Height and weight. These may be used to calculate body mass index (BMI), which is a measurement that tells if you are at a healthy weight.  Heart rate and blood pressure.  Your skin for abnormal spots. Counseling Your health care provider may ask you questions about:  Alcohol, tobacco, and drug use.  Emotional well-being.  Home and relationship well-being.  Sexual activity.  Eating habits.  Work and work Statistician. What immunizations do I need?  Influenza (flu) vaccine  This is recommended every year. Tetanus, diphtheria, and pertussis (Tdap) vaccine  You may need a Td booster every 10 years. Varicella (chickenpox) vaccine  You may need this vaccine if you have not already been vaccinated. Zoster (shingles) vaccine  You may need this after age 64. Measles, mumps, and rubella (MMR) vaccine  You may need at least one dose of MMR if you were born in 1957 or later. You may also need a second dose. Pneumococcal conjugate (PCV13) vaccine  You may need this if you have certain conditions and were not previously vaccinated. Pneumococcal polysaccharide (PPSV23) vaccine  You may need one or two doses if you smoke cigarettes or if you have certain conditions. Meningococcal conjugate (MenACWY) vaccine  You may need this if you have certain conditions. Hepatitis A  vaccine  You may need this if you have certain conditions or if you travel or work in places where you may be exposed to hepatitis A. Hepatitis B vaccine  You may need this if you have certain conditions or if you travel or work in places where you may be exposed to hepatitis B. Haemophilus influenzae type b (Hib) vaccine  You may need this if you have certain risk factors. Human papillomavirus (HPV) vaccine  If recommended by your health care provider, you may need three doses over 6 months. You may receive vaccines as individual doses or as more than one vaccine together in one shot (combination vaccines). Talk with your health care provider about the risks and benefits of combination vaccines. What tests do I need? Blood tests  Lipid and cholesterol levels. These may be checked every 5 years, or more frequently if you are over 60 years old.  Hepatitis C test.  Hepatitis B test. Screening  Lung cancer screening. You may have this screening every year starting at age 43 if you have a 30-pack-year history of smoking and currently smoke or have quit within the past 15 years.  Prostate cancer screening. Recommendations will vary depending on your family history and other risks.  Colorectal cancer screening. All adults should have this screening starting at age 72 and continuing until age 2. Your health care provider may recommend screening at age 14 if you are at increased risk. You will have tests every 1-10 years, depending on your results and the type of screening test.  Diabetes screening. This is done by checking your blood sugar (glucose) after you have not eaten  for a while (fasting). You may have this done every 1-3 years.  Sexually transmitted disease (STD) testing. Follow these instructions at home: Eating and drinking  Eat a diet that includes fresh fruits and vegetables, whole grains, lean protein, and low-fat dairy products.  Take vitamin and mineral supplements as  recommended by your health care provider.  Do not drink alcohol if your health care provider tells you not to drink.  If you drink alcohol: ? Limit how much you have to 0-2 drinks a day. ? Be aware of how much alcohol is in your drink. In the [place] one drink equals one 12 oz bottle of beer (355 mL), one 5 oz glass of wine (148 mL), or one 1 oz glass of hard liquor (44 mL). Lifestyle  Take daily care of your teeth and gums.  Stay active. Exercise for at least 30 minutes on 5 or more days each week.  Do not use any products that contain nicotine or tobacco, such as cigarettes, e-cigarettes, and chewing tobacco. If you need help quitting, ask your health care provider.  If you are sexually active, practice safe sex. Use a condom or other form of protection to prevent STIs (sexually transmitted infections).  Talk with your health care provider about taking a low-dose aspirin every day starting at age 53. What's next?  Go to your health care provider once a year for a well check visit.  Ask your health care provider how often you should have your eyes and teeth checked.  Stay up to date on all vaccines. This information is not intended to replace advice given to you by your health care provider. Make sure you discuss any questions you have with your health care provider. Document Revised: 03/30/2018 Document Reviewed: 03/30/2018 Elsevier Patient Education  2020 [employer].

## 2019-09-20 NOTE — Progress Notes (Signed)
Patient ID: Christopher SCHOEPP, male    DOB: 04/13/1961  Age: 59 y.o. MRN: [id number]    Subjective:  Subjective  HPI RASTUS NEIDHART presents for cpe--- no complaints    Review of Systems  Constitutional: Negative for appetite change, diaphoresis, fatigue and unexpected weight change.  Eyes: Negative for pain, redness and visual disturbance.  Respiratory: Negative for cough, chest tightness, shortness of breath and wheezing.   Cardiovascular: Negative for chest pain, palpitations and leg swelling.  Endocrine: Negative for cold intolerance, heat intolerance, polydipsia, polyphagia and polyuria.  Genitourinary: Negative for difficulty urinating, dysuria and frequency.  Neurological: Negative for dizziness, light-headedness, numbness and headaches.    History Past Medical History:  Diagnosis Date  . GERD (gastroesophageal reflux disease)     He has no past surgical history on file.   His family history is not on file.He reports that he has never smoked. He has never used smokeless tobacco. He reports that he does not drink alcohol or use drugs.  No current outpatient medications on file prior to visit.   No current facility-administered medications on file prior to visit.     Objective:  Objective  Physical Exam Vitals and nursing note reviewed.  Constitutional:      General: He is not in acute distress.    Appearance: He is well-developed. He is not diaphoretic.  HENT:     Head: Normocephalic and atraumatic.     Right Ear: External ear normal.     Left Ear: External ear normal.     Nose: Nose normal.     Mouth/Throat:     Pharynx: No oropharyngeal exudate.  Eyes:     General:        Right eye: No discharge.        Left eye: No discharge.     Conjunctiva/sclera: Conjunctivae normal.     Pupils: Pupils are equal, round, and reactive to light.  Neck:     Thyroid: No thyromegaly.     Vascular: No JVD.  Cardiovascular:     Rate and Rhythm: Normal rate and regular  rhythm.     Heart sounds: No murmur. No friction rub. No gallop.   Pulmonary:     Effort: Pulmonary effort is normal. No respiratory distress.     Breath sounds: Normal breath sounds. No wheezing or rales.  Chest:     Chest wall: No tenderness.  Abdominal:     General: Bowel sounds are normal. There is no distension.     Palpations: Abdomen is soft. There is no mass.     Tenderness: There is no abdominal tenderness. There is no guarding or rebound.  Genitourinary:    Comments: GI does colon annually and prostate checked then Pt prefers not to do today Musculoskeletal:        General: No tenderness. Normal range of motion.     Cervical back: Normal range of motion and neck supple.  Lymphadenopathy:     Cervical: No cervical adenopathy.  Skin:    General: Skin is warm and dry.     Coloration: Skin is not pale.     Findings: No erythema or rash.  Neurological:     Mental Status: He is alert and oriented to person, place, and time.     Motor: No abnormal muscle tone.     Deep Tendon Reflexes: Reflexes are normal and symmetric. Reflexes normal.  Psychiatric:        Behavior: Behavior normal.  Thought Content: Thought content normal.        Judgment: Judgment normal.    BP 104/60 (BP Location: Right Arm, Patient Position: Sitting, Cuff Size: Normal)   Pulse 68   Temp 97.7 F (36.5 C) (Temporal)   Resp 18   Ht 5\' 10"  (1.778 m)   Wt 182 lb (82.6 kg)   SpO2 97%   BMI 26.11 kg/m  Wt Readings from Last 3 Encounters:  09/20/19 182 lb (82.6 kg)  01/08/19 172 lb 12.8 oz (78.4 kg)  04/20/18 179 lb (81.2 kg)     Lab Results  Component Value Date   WBC 7.8 10/29/2016   HGB 13.5 10/29/2016   HCT 40.0 10/29/2016   PLT 286 10/29/2016   GLUCOSE 103 (H) 10/29/2016   CHOL 226 (H) 11/04/2014   TRIG 215.0 (H) 11/04/2014   HDL 47.40 11/04/2014   LDLDIRECT 145.0 11/04/2014   ALT 11 10/29/2016   AST 17 10/29/2016   NA 139 10/29/2016   K 3.9 10/29/2016   CL 102 10/29/2016    CREATININE 1.04 10/29/2016   BUN 11 10/29/2016   CO2 24 10/29/2016   TSH 1.35 10/29/2016   PSA 0.5 10/29/2016    No results found.   Assessment & Plan:  Plan  I have discontinued Dariyon Baringer. Furr's azithromycin and nizatidine. I have also changed his pantoprazole. Additionally, I am having him maintain his valACYclovir and loperamide.  Meds ordered this encounter  Medications  . valACYclovir (VALTREX) 1000 MG tablet    Sig: Take 1 tablet (1,000 mg total) by mouth daily.    Dispense:  90 tablet    Refill:  3  . pantoprazole (PROTONIX) 40 MG tablet    Sig: Take 1 tablet (40 mg total) by mouth daily.    Dispense:  90 tablet    Refill:  3  . loperamide (IMODIUM A-D) 2 MG tablet    Sig: Take 2 tablets (4 mg total) by mouth 3 (three) times daily.    Dispense:  540 tablet    Refill:  3    Problem List Items Addressed This Visit      Unprioritized   BARRETTS ESOPHAGUS    Per gi con't protonix      Irritable bowel syndrome (IBS)    Per gi[facility] con't immodium       Relevant Medications   pantoprazole (PROTONIX) 40 MG tablet   loperamide (IMODIUM A-D) 2 MG tablet   Preventative health care - Primary    ghm utd Check labs  See aVS      Relevant Orders   Lipid panel   CBC with Differential/Platelet   PSA   TSH   Comprehensive metabolic panel    Other Visit Diagnoses    Gastroesophageal reflux disease       Relevant Medications   pantoprazole (PROTONIX) 40 MG tablet   loperamide (IMODIUM A-D) 2 MG tablet   Genital herpes simplex, unspecified site       Relevant Medications   valACYclovir (VALTREX) 1000 MG tablet      Follow-up: Return in about 1 year (around 09/19/2020), or if symptoms worsen or fail to improve, for annual exam, fasting.  Ann Held, DO

## 2019-09-20 NOTE — Assessment & Plan Note (Signed)
ghm utd ?Check labs  ?See aVS ? ?

## 2019-09-21 LAB — LIPID PANEL
Cholesterol: 210 mg/dL — ABNORMAL HIGH (ref 0–200)
HDL: 44.8 mg/dL (ref >39.00)
NonHDL: 165.62
Total CHOL/HDL Ratio: 5
Triglycerides: 259 mg/dL — ABNORMAL HIGH (ref 0.0–149.0)
VLDL: 51.8 mg/dL — ABNORMAL HIGH (ref 0.0–40.0)

## 2019-09-21 LAB — CBC WITH DIFFERENTIAL/PLATELET
Basophils Absolute: 0.1 10*3/uL (ref 0.0–0.1)
Basophils Relative: 1.2 % (ref 0.0–3.0)
Eosinophils Absolute: 0.1 10*3/uL (ref 0.0–0.7)
Eosinophils Relative: 1 % (ref 0.0–5.0)
HCT: 37.9 % — ABNORMAL LOW (ref 39.0–52.0)
Hemoglobin: 13.2 g/dL (ref 13.0–17.0)
Lymphocytes Relative: 29.7 % (ref 12.0–46.0)
Lymphs Abs: 2.4 10*3/uL (ref 0.7–4.0)
MCHC: 34.9 g/dL (ref 30.0–36.0)
MCV: 88.7 fl (ref 78.0–100.0)
Monocytes Absolute: 0.6 10*3/uL (ref 0.1–1.0)
Monocytes Relative: 6.8 % (ref 3.0–12.0)
Neutro Abs: 5.1 10*3/uL (ref 1.4–7.7)
Neutrophils Relative %: 61.3 % (ref 43.0–77.0)
Platelets: 253 10*3/uL (ref 150.0–400.0)
RBC: 4.27 Mil/uL (ref 4.22–5.81)
RDW: 12.6 % (ref 11.5–15.5)
WBC: 8.2 10*3/uL (ref 4.0–10.5)

## 2019-09-21 LAB — TSH: TSH: 1.02 u[IU]/mL (ref 0.35–4.50)

## 2019-09-21 LAB — COMPREHENSIVE METABOLIC PANEL
ALT: 15 U/L (ref 0–53)
AST: 21 U/L (ref 0–37)
Albumin: 4.4 g/dL (ref 3.5–5.2)
Alkaline Phosphatase: 59 U/L (ref 39–117)
BUN: 10 mg/dL (ref 6–23)
CO2: 30 mEq/L (ref 19–32)
Calcium: 9 mg/dL (ref 8.4–10.5)
Chloride: 103 mEq/L (ref 96–112)
Creatinine, Ser: 0.94 mg/dL (ref 0.40–1.50)
GFR: 82.38 mL/min (ref >60.00)
Glucose, Bld: 110 mg/dL — ABNORMAL HIGH (ref 70–99)
Potassium: 3.8 mEq/L (ref 3.5–5.1)
Sodium: 140 mEq/L (ref 135–145)
Total Bilirubin: 0.4 mg/dL (ref 0.2–1.2)
Total Protein: 6.7 g/dL (ref 6.0–8.3)

## 2019-09-21 LAB — PSA: PSA: 0.55 ng/mL (ref 0.10–4.00)

## 2019-09-21 LAB — LDL CHOLESTEROL, DIRECT: Direct LDL: 136 mg/dL

## 2019-09-22 ENCOUNTER — Other Ambulatory Visit: Payer: Self-pay | Admitting: Family Medicine

## 2019-09-22 ENCOUNTER — Encounter: Payer: Self-pay | Admitting: Family Medicine

## 2019-09-22 DIAGNOSIS — E781 Pure hyperglyceridemia: Secondary | ICD-10-CM

## 2019-09-23 ENCOUNTER — Encounter: Payer: Self-pay | Admitting: Family Medicine

## 2019-09-24 NOTE — Telephone Encounter (Signed)
Ok to switch to omeprazole 20 mg 1 po qd #90 , 3 refills

## 2019-09-25 MED ORDER — OMEPRAZOLE 20 MG PO CPDR
20.0000 mg | DELAYED_RELEASE_CAPSULE | Freq: Every day | ORAL | 3 refills | Status: DC
Start: 2019-09-25 — End: 2019-10-23

## 2019-09-26 ENCOUNTER — Encounter: Payer: Self-pay | Admitting: Family Medicine

## 2019-09-26 DIAGNOSIS — A6000 Herpesviral infection of urogenital system, unspecified: Secondary | ICD-10-CM

## 2019-09-27 MED ORDER — VALACYCLOVIR HCL 1 G PO TABS: 1000.0000 mg | ORAL_TABLET | Freq: Every day | ORAL | 3 refills | Status: DC

## 2019-10-11 DIAGNOSIS — M79672 Pain in left foot: Secondary | ICD-10-CM | POA: Diagnosis not present

## 2019-10-11 DIAGNOSIS — M79671 Pain in right foot: Secondary | ICD-10-CM | POA: Diagnosis not present

## 2019-10-11 DIAGNOSIS — M7741 Metatarsalgia, right foot: Secondary | ICD-10-CM | POA: Diagnosis not present

## 2019-10-22 ENCOUNTER — Encounter: Payer: Self-pay | Admitting: Family Medicine

## 2019-10-23 MED ORDER — OMEPRAZOLE 40 MG PO CPDR
40.0000 mg | DELAYED_RELEASE_CAPSULE | Freq: Every day | ORAL | 1 refills | Status: DC
Start: 2019-10-23 — End: 2020-04-16

## 2019-11-29 DIAGNOSIS — Z20822 Contact with and (suspected) exposure to covid-19: Secondary | ICD-10-CM | POA: Diagnosis not present

## 2020-04-16 ENCOUNTER — Encounter: Payer: Self-pay | Admitting: Family Medicine

## 2020-04-16 ENCOUNTER — Other Ambulatory Visit: Payer: Self-pay | Admitting: Family Medicine

## 2020-04-16 NOTE — Telephone Encounter (Signed)
So according to his chart he has barretts esophagus--is he asking about protonix or omeprazole?  Ok to send in omeprazole 20 mg 1-2 a day #180 3 refills

## 2020-04-17 MED ORDER — OMEPRAZOLE 20 MG PO CPDR
20.0000 mg | DELAYED_RELEASE_CAPSULE | Freq: Every day | ORAL | 3 refills | Status: DC
Start: 2020-04-17 — End: 2020-04-27

## 2020-04-23 ENCOUNTER — Emergency Department (HOSPITAL_COMMUNITY): Payer: BC Managed Care – PPO

## 2020-04-23 ENCOUNTER — Inpatient Hospital Stay (HOSPITAL_COMMUNITY)
Admission: EM | Admit: 2020-04-23 | Discharge: 2020-04-27 | DRG: 199 | Disposition: A | Discharge status: Home / Self Care | Payer: BC Managed Care – PPO | Attending: General Surgery | Admitting: General Surgery

## 2020-04-23 ENCOUNTER — Encounter (HOSPITAL_COMMUNITY): Payer: Self-pay

## 2020-04-23 DIAGNOSIS — S27321A Contusion of lung, unilateral, initial encounter: Secondary | ICD-10-CM | POA: Diagnosis not present

## 2020-04-23 DIAGNOSIS — R0781 Pleurodynia: Secondary | ICD-10-CM | POA: Diagnosis not present

## 2020-04-23 DIAGNOSIS — R000 Tachycardia, unspecified: Secondary | ICD-10-CM | POA: Diagnosis not present

## 2020-04-23 DIAGNOSIS — Y92007 Garden or yard of unspecified non-institutional (private) residence as the place of occurrence of the external cause: Secondary | ICD-10-CM

## 2020-04-23 DIAGNOSIS — Y9339 Activity, other involving climbing, rappelling and jumping off: Secondary | ICD-10-CM | POA: Diagnosis not present

## 2020-04-23 DIAGNOSIS — R0602 Shortness of breath: Secondary | ICD-10-CM | POA: Diagnosis not present

## 2020-04-23 DIAGNOSIS — S27331A Laceration of lung, unilateral, initial encounter: Secondary | ICD-10-CM | POA: Diagnosis not present

## 2020-04-23 DIAGNOSIS — M25552 Pain in left hip: Secondary | ICD-10-CM | POA: Diagnosis not present

## 2020-04-23 DIAGNOSIS — R739 Hyperglycemia, unspecified: Secondary | ICD-10-CM | POA: Diagnosis present

## 2020-04-23 DIAGNOSIS — K219 Gastro-esophageal reflux disease without esophagitis: Secondary | ICD-10-CM | POA: Diagnosis not present

## 2020-04-23 DIAGNOSIS — T1490XA Injury, unspecified, initial encounter: Secondary | ICD-10-CM

## 2020-04-23 DIAGNOSIS — J939 Pneumothorax, unspecified: Secondary | ICD-10-CM

## 2020-04-23 DIAGNOSIS — K589 Irritable bowel syndrome without diarrhea: Secondary | ICD-10-CM | POA: Diagnosis present

## 2020-04-23 DIAGNOSIS — S2242XA Multiple fractures of ribs, left side, initial encounter for closed fracture: Secondary | ICD-10-CM | POA: Diagnosis present

## 2020-04-23 DIAGNOSIS — K5730 Diverticulosis of large intestine without perforation or abscess without bleeding: Secondary | ICD-10-CM | POA: Diagnosis not present

## 2020-04-23 DIAGNOSIS — W010XXA Fall on same level from slipping, tripping and stumbling without subsequent striking against object, initial encounter: Secondary | ICD-10-CM | POA: Diagnosis not present

## 2020-04-23 DIAGNOSIS — S270XXA Traumatic pneumothorax, initial encounter: Secondary | ICD-10-CM | POA: Diagnosis not present

## 2020-04-23 DIAGNOSIS — S2232XA Fracture of one rib, left side, initial encounter for closed fracture: Secondary | ICD-10-CM | POA: Diagnosis not present

## 2020-04-23 DIAGNOSIS — Z9889 Other specified postprocedural states: Secondary | ICD-10-CM

## 2020-04-23 DIAGNOSIS — J9811 Atelectasis: Secondary | ICD-10-CM | POA: Diagnosis not present

## 2020-04-23 DIAGNOSIS — R0902 Hypoxemia: Secondary | ICD-10-CM | POA: Diagnosis not present

## 2020-04-23 DIAGNOSIS — W19XXXA Unspecified fall, initial encounter: Secondary | ICD-10-CM

## 2020-04-23 DIAGNOSIS — Z9689 Presence of other specified functional implants: Secondary | ICD-10-CM

## 2020-04-23 DIAGNOSIS — R52 Pain, unspecified: Secondary | ICD-10-CM | POA: Diagnosis not present

## 2020-04-23 DIAGNOSIS — J439 Emphysema, unspecified: Secondary | ICD-10-CM | POA: Diagnosis not present

## 2020-04-23 DIAGNOSIS — Z20822 Contact with and (suspected) exposure to covid-19: Secondary | ICD-10-CM | POA: Diagnosis present

## 2020-04-23 DIAGNOSIS — J90 Pleural effusion, not elsewhere classified: Secondary | ICD-10-CM | POA: Diagnosis not present

## 2020-04-23 LAB — CBC WITH DIFFERENTIAL/PLATELET
Abs Immature Granulocytes: 0.06 10*3/uL (ref 0.00–0.07)
Basophils Absolute: 0 10*3/uL (ref 0.0–0.1)
Basophils Relative: 0 %
Eosinophils Absolute: 0 10*3/uL (ref 0.0–0.5)
Eosinophils Relative: 0 %
HCT: 40.6 % (ref 39.0–52.0)
Hemoglobin: 14.1 g/dL (ref 13.0–17.0)
Immature Granulocytes: 0 %
Lymphocytes Relative: 4 %
Lymphs Abs: 0.7 10*3/uL (ref 0.7–4.0)
MCH: 30.9 pg (ref 26.0–34.0)
MCHC: 34.7 g/dL (ref 30.0–36.0)
MCV: 89 fL (ref 80.0–100.0)
Monocytes Absolute: 0.6 10*3/uL (ref 0.1–1.0)
Monocytes Relative: 4 %
Neutro Abs: 15.9 10*3/uL — ABNORMAL HIGH (ref 1.7–7.7)
Neutrophils Relative %: 92 %
Platelets: 286 10*3/uL (ref 150–400)
RBC: 4.56 MIL/uL (ref 4.22–5.81)
RDW: 12 % (ref 11.5–15.5)
WBC: 17.3 10*3/uL — ABNORMAL HIGH (ref 4.0–10.5)
nRBC: 0 % (ref 0.0–0.2)

## 2020-04-23 LAB — URINALYSIS, ROUTINE W REFLEX MICROSCOPIC
Bilirubin Urine: NEGATIVE
Glucose, UA: NEGATIVE mg/dL
Hgb urine dipstick: NEGATIVE
Ketones, ur: 20 mg/dL — AB
Leukocytes,Ua: NEGATIVE
Nitrite: NEGATIVE
Protein, ur: NEGATIVE mg/dL
Specific Gravity, Urine: 1.019 (ref 1.005–1.030)
pH: 5 (ref 5.0–8.0)

## 2020-04-23 LAB — ETHANOL: Alcohol, Ethyl (B): <10 mg/dL (ref <10)

## 2020-04-23 LAB — COMPREHENSIVE METABOLIC PANEL
ALT: 22 U/L (ref 0–44)
AST: 30 U/L (ref 15–41)
Albumin: 4.2 g/dL (ref 3.5–5.0)
Alkaline Phosphatase: 56 U/L (ref 38–126)
Anion gap: 10 (ref 5–15)
BUN: 10 mg/dL (ref 6–20)
CO2: 24 mmol/L (ref 22–32)
Calcium: 9.1 mg/dL (ref 8.9–10.3)
Chloride: 105 mmol/L (ref 98–111)
Creatinine, Ser: 0.95 mg/dL (ref 0.61–1.24)
GFR, Estimated: >60 mL/min (ref >60)
Glucose, Bld: 153 mg/dL — ABNORMAL HIGH (ref 70–99)
Potassium: 4.1 mmol/L (ref 3.5–5.1)
Sodium: 139 mmol/L (ref 135–145)
Total Bilirubin: 0.9 mg/dL (ref 0.3–1.2)
Total Protein: 7.5 g/dL (ref 6.5–8.1)

## 2020-04-23 LAB — CBC
HCT: 38.6 % — ABNORMAL LOW (ref 39.0–52.0)
Hemoglobin: 13.2 g/dL (ref 13.0–17.0)
MCH: 30.6 pg (ref 26.0–34.0)
MCHC: 34.2 g/dL (ref 30.0–36.0)
MCV: 89.4 fL (ref 80.0–100.0)
Platelets: 280 10*3/uL (ref 150–400)
RBC: 4.32 MIL/uL (ref 4.22–5.81)
RDW: 12 % (ref 11.5–15.5)
WBC: 13.2 10*3/uL — ABNORMAL HIGH (ref 4.0–10.5)
nRBC: 0 % (ref 0.0–0.2)

## 2020-04-23 LAB — I-STAT CHEM 8, ED
BUN: 11 mg/dL (ref 6–20)
Calcium, Ion: 1.17 mmol/L (ref 1.15–1.40)
Chloride: 103 mmol/L (ref 98–111)
Creatinine, Ser: 0.8 mg/dL (ref 0.61–1.24)
Glucose, Bld: 149 mg/dL — ABNORMAL HIGH (ref 70–99)
HCT: 41 % (ref 39.0–52.0)
Hemoglobin: 13.9 g/dL (ref 13.0–17.0)
Potassium: 4 mmol/L (ref 3.5–5.1)
Sodium: 142 mmol/L (ref 135–145)
TCO2: 26 mmol/L (ref 22–32)

## 2020-04-23 LAB — SAMPLE TO BLOOD BANK

## 2020-04-23 LAB — PROTIME-INR
INR: 1 (ref 0.8–1.2)
Prothrombin Time: 13 seconds (ref 11.4–15.2)

## 2020-04-23 LAB — LACTIC ACID, PLASMA: Lactic Acid, Venous: 1.9 mmol/L (ref 0.5–1.9)

## 2020-04-23 MED ORDER — FENTANYL CITRATE (PF) 100 MCG/2ML IJ SOLN
100.0000 ug | Freq: Once | INTRAMUSCULAR | Status: AC
  Administered 2020-04-23: 100 ug via INTRAVENOUS
  Filled 2020-04-23: qty 2

## 2020-04-23 MED ORDER — ONDANSETRON HCL 4 MG/2ML IJ SOLN
4.0000 mg | Freq: Once | INTRAMUSCULAR | Status: AC
  Administered 2020-04-23: 4 mg via INTRAVENOUS
  Filled 2020-04-23: qty 2

## 2020-04-23 MED ORDER — LOPERAMIDE HCL 2 MG PO CAPS: 4.0000 mg | ORAL_CAPSULE | Freq: Three times a day (TID) | ORAL | Status: DC | PRN

## 2020-04-23 MED ORDER — IBUPROFEN 400 MG PO TABS: 600.0000 mg | ORAL_TABLET | Freq: Four times a day (QID) | ORAL | Status: DC | PRN

## 2020-04-23 MED ORDER — SODIUM CHLORIDE 0.9 % IV BOLUS
1000.0000 mL | Freq: Once | INTRAVENOUS | Status: AC
  Administered 2020-04-23: 1000 mL via INTRAVENOUS

## 2020-04-23 MED ORDER — PANTOPRAZOLE SODIUM 40 MG PO TBEC
40.0000 mg | DELAYED_RELEASE_TABLET | Freq: Every day | ORAL | Status: DC
  Administered 2020-04-24 – 2020-04-27 (×4): 40 mg via ORAL
  Filled 2020-04-23 (×4): qty 1

## 2020-04-23 MED ORDER — ONDANSETRON HCL 4 MG/2ML IJ SOLN: 4.0000 mg | Freq: Four times a day (QID) | INTRAMUSCULAR | Status: DC | PRN

## 2020-04-23 MED ORDER — MORPHINE SULFATE (PF) 4 MG/ML IV SOLN
4.0000 mg | Freq: Once | INTRAVENOUS | Status: AC
  Administered 2020-04-23: 4 mg via INTRAVENOUS
  Filled 2020-04-23: qty 1

## 2020-04-23 MED ORDER — ACETAMINOPHEN 325 MG PO TABS: 650.0000 mg | ORAL_TABLET | ORAL | Status: DC | PRN

## 2020-04-23 MED ORDER — FENTANYL CITRATE (PF) 100 MCG/2ML IJ SOLN
INTRAMUSCULAR | Status: AC
  Administered 2020-04-23: 100 ug via INTRAVENOUS
  Filled 2020-04-23: qty 2

## 2020-04-23 MED ORDER — SODIUM CHLORIDE 0.9% FLUSH
3.0000 mL | Freq: Two times a day (BID) | INTRAVENOUS | Status: DC
  Administered 2020-04-23 – 2020-04-26 (×5): 3 mL via INTRAVENOUS

## 2020-04-23 MED ORDER — FENTANYL CITRATE (PF) 100 MCG/2ML IJ SOLN: 100.0000 ug | Freq: Once | INTRAMUSCULAR | Status: AC

## 2020-04-23 MED ORDER — HYDROMORPHONE HCL 1 MG/ML IJ SOLN
1.0000 mg | Freq: Once | INTRAMUSCULAR | Status: AC
  Administered 2020-04-23: 1 mg via INTRAVENOUS
  Filled 2020-04-23: qty 1

## 2020-04-23 MED ORDER — DIPHENHYDRAMINE HCL 25 MG PO CAPS: 25.0000 mg | ORAL_CAPSULE | Freq: Four times a day (QID) | ORAL | Status: DC | PRN

## 2020-04-23 MED ORDER — VALACYCLOVIR HCL 500 MG PO TABS
1000.0000 mg | ORAL_TABLET | Freq: Every day | ORAL | Status: DC
  Administered 2020-04-27: 1000 mg via ORAL
  Filled 2020-04-23 (×4): qty 2

## 2020-04-23 MED ORDER — ENOXAPARIN SODIUM 30 MG/0.3ML ~~LOC~~ SOLN
30.0000 mg | Freq: Two times a day (BID) | SUBCUTANEOUS | Status: DC
  Administered 2020-04-25 – 2020-04-26 (×3): 30 mg via SUBCUTANEOUS
  Filled 2020-04-23 (×5): qty 0.3

## 2020-04-23 MED ORDER — LIDOCAINE HCL (PF) 1 % IJ SOLN
30.0000 mL | Freq: Once | INTRAMUSCULAR | Status: AC
  Administered 2020-04-23: 30 mL via INTRADERMAL
  Filled 2020-04-23: qty 30

## 2020-04-23 MED ORDER — OXYCODONE HCL 5 MG PO TABS
5.0000 mg | ORAL_TABLET | ORAL | Status: DC | PRN
  Administered 2020-04-24: 5 mg via ORAL
  Filled 2020-04-23: qty 1

## 2020-04-23 MED ORDER — FENTANYL CITRATE (PF) 100 MCG/2ML IJ SOLN
50.0000 ug | INTRAMUSCULAR | Status: DC | PRN
  Administered 2020-04-24: 50 ug via INTRAVENOUS
  Administered 2020-04-24 – 2020-04-25 (×5): 100 ug via INTRAVENOUS
  Filled 2020-04-23 (×6): qty 2

## 2020-04-23 MED ORDER — SODIUM CHLORIDE 0.9 % IV SOLN: 250.0000 mL | INTRAVENOUS | Status: DC | PRN

## 2020-04-23 MED ORDER — IOHEXOL 300 MG/ML  SOLN
100.0000 mL | Freq: Once | INTRAMUSCULAR | Status: AC | PRN
  Administered 2020-04-23: 100 mL via INTRAVENOUS

## 2020-04-23 MED ORDER — ACETAMINOPHEN 325 MG PO TABS
650.0000 mg | ORAL_TABLET | Freq: Once | ORAL | Status: AC
  Administered 2020-04-23: 650 mg via ORAL
  Filled 2020-04-23: qty 2

## 2020-04-23 MED ORDER — SODIUM CHLORIDE 0.9% FLUSH: 3.0000 mL | INTRAVENOUS | Status: DC | PRN

## 2020-04-23 MED ORDER — PROCHLORPERAZINE EDISYLATE 10 MG/2ML IJ SOLN
5.0000 mg | Freq: Four times a day (QID) | INTRAMUSCULAR | Status: DC | PRN
  Filled 2020-04-23: qty 2

## 2020-04-23 MED ORDER — ONDANSETRON 4 MG PO TBDP: 4.0000 mg | ORAL_TABLET | Freq: Four times a day (QID) | ORAL | Status: DC | PRN

## 2020-04-23 MED ORDER — METHOCARBAMOL 500 MG PO TABS: 500.0000 mg | ORAL_TABLET | Freq: Four times a day (QID) | ORAL | Status: DC | PRN

## 2020-04-23 MED ORDER — PROCHLORPERAZINE MALEATE 10 MG PO TABS
10.0000 mg | ORAL_TABLET | Freq: Four times a day (QID) | ORAL | Status: DC | PRN
  Filled 2020-04-23: qty 1

## 2020-04-23 NOTE — ED Notes (Signed)
Pt transported to XRAY °

## 2020-04-23 NOTE — ED Notes (Signed)
SPo2 92% on RA, 2L provided for comfort.

## 2020-04-23 NOTE — H&P (Signed)
History   Christopher Bell is an 60 y.o. male.   Chief Complaint:  Chief Complaint  Patient presents with  . Fall    Pt is a 60 yo M who presented to the ED today after a fall.  He tried jumping over a fence to get to the back door of his mother's house since the front storm door was locked.  He fell due to the muddy ground and struck his left side on the fence.  He had immediate pain and shortness of breath.  He was seen to have left rib fractures and subcutaneous air.  His initial chest xray showed a small PTX, but it appeared more moderate on CT, and a follow up chest xray showed a larger left PTX. His shortness of breath is mild, but he is having lots of left chest pain.  He denies n/v.  He denies striking anything other than his left side.  This was a witnessed fall.    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  He knows Rodney Langton and [employer].    Of note, he has had 3 COVID vaccines and has no upper respiratory symptoms or known sick contacts.     Past Medical History:  Diagnosis Date  . GERD (gastroesophageal reflux disease)     History reviewed. No pertinent surgical history.  No family history on file. Social History:  reports that he has never smoked. He has never used smokeless tobacco. He reports that he does not drink alcohol and does not use drugs.  Allergies  No Known Allergies  Home Medications   No outpatient medications have been marked as taking for the 04/23/20 encounter [facility] Encounter).     Trauma Course   Results for orders placed or performed during the hospital encounter of 04/23/20 (from the past 48 hour(s))  CBC with Differential     Status: Abnormal   Collection Time: 04/23/20  3:28 PM  Result Value Ref Range   WBC 17.3 (H) 4.0 - 10.5 K/uL   RBC 4.56 4.22 - 5.81 MIL/uL   Hemoglobin 14.1 13.0 - 17.0 g/dL   HCT 40.6 39.0  - 52.0 %   MCV 89.0 80.0 - 100.0 fL   MCH 30.9 26.0 - 34.0 pg   MCHC 34.7 30.0 - 36.0 g/dL   RDW 12.0 11.5 - 15.5 %   Platelets 286 150 - 400 K/uL   nRBC 0.0 0.0 - 0.2 %   Neutrophils Relative % 92 %   Neutro Abs 15.9 (H) 1.7 - 7.7 K/uL   Lymphocytes Relative 4 %   Lymphs Abs 0.7 0.7 - 4.0 K/uL   Monocytes Relative 4 %   Monocytes Absolute 0.6 0.1 - 1.0 K/uL   Eosinophils Relative 0 %   Eosinophils Absolute 0.0 0.0 - 0.5 K/uL   Basophils Relative 0 %   Basophils Absolute 0.0 0.0 - 0.1 K/uL   Immature Granulocytes 0 %   Abs Immature Granulocytes 0.06 0.00 - 0.07 K/uL    Comment: Performed at [facility], [address], [place], [place] [address]  Comprehensive metabolic panel     Status: Abnormal   Collection Time: 04/23/20  3:28 PM  Result Value Ref Range   Sodium 139 135 - 145 mmol/L   Potassium 4.1 3.5 - 5.1 mmol/L   Chloride 105 98 - 111 mmol/L  CO2 24 22 - 32 mmol/L   Glucose, Bld 153 (H) 70 - 99 mg/dL    Comment: Glucose reference range applies only to samples taken after fasting for at least 8 hours.   BUN 10 6 - 20 mg/dL   Creatinine, Ser 0.95 0.61 - 1.24 mg/dL   Calcium 9.1 8.9 - 10.3 mg/dL   Total Protein 7.5 6.5 - 8.1 g/dL   Albumin 4.2 3.5 - 5.0 g/dL   AST 30 15 - 41 U/L   ALT 22 0 - 44 U/L   Alkaline Phosphatase 56 38 - 126 U/L   Total Bilirubin 0.9 0.3 - 1.2 mg/dL   GFR, Estimated >60 >60 mL/min    Comment: (NOTE) Calculated using the CKD-EPI Creatinine Equation (2021)    Anion gap 10 5 - 15    Comment: Performed at [facility] [address], [place], [place] [address]  Ethanol     Status: None   Collection Time: 04/23/20  3:28 PM  Result Value Ref Range   Alcohol, Ethyl (B) <10 <10 mg/dL    Comment: (NOTE) Lowest detectable limit for serum alcohol is 10 mg/dL.  For medical purposes only. Performed at [facility], [place] [address] [place], [place] [address]   Lactic acid, plasma     Status: None   Collection Time: 04/23/20   3:28 PM  Result Value Ref Range   Lactic Acid, Venous 1.9 0.5 - 1.9 mmol/L    Comment: Performed at [facility] [address] [place], [place] [address]  Protime-INR     Status: None   Collection Time: 04/23/20  3:28 PM  Result Value Ref Range   Prothrombin Time 13.0 11.4 - 15.2 seconds   INR 1.0 0.8 - 1.2    Comment: (NOTE) INR goal varies based on device and disease states. Performed at [facility], [place] [address] [place], [place] [address]   Sample to Blood Bank     Status: None   Collection Time: 04/23/20  3:59 PM  Result Value Ref Range   Blood Bank Specimen SAMPLE AVAILABLE FOR TESTING    Sample Expiration      04/24/2020,2359 Performed at [facility], [place] [address], [place], [place] [address]   I-Stat Chem 8, ED     Status: Abnormal   Collection Time: 04/23/20  4:30 PM  Result Value Ref Range   Sodium 142 135 - 145 mmol/L   Potassium 4.0 3.5 - 5.1 mmol/L   Chloride 103 98 - 111 mmol/L   BUN 11 6 - 20 mg/dL   Creatinine, Ser 0.80 0.61 - 1.24 mg/dL   Glucose, Bld 149 (H) 70 - 99 mg/dL    Comment: Glucose reference range applies only to samples taken after fasting for at least 8 hours.   Calcium, Ion 1.17 1.15 - 1.40 mmol/L   TCO2 26 22 - 32 mmol/L   Hemoglobin 13.9 13.0 - 17.0 g/dL   HCT 41.0 39.0 - 52.0 %  Urinalysis, Routine w reflex microscopic Urine, Clean Catch     Status: Abnormal   Collection Time: 04/23/20  5:30 PM  Result Value Ref Range   Color, Urine YELLOW YELLOW   APPearance CLEAR CLEAR   Specific Gravity, Urine 1.019 1.005 - 1.030   pH 5.0 5.0 - 8.0   Glucose, UA NEGATIVE NEGATIVE mg/dL   Hgb urine dipstick NEGATIVE NEGATIVE   Bilirubin Urine NEGATIVE NEGATIVE   Ketones, ur 20 (A)  NEGATIVE mg/dL   Protein, ur NEGATIVE NEGATIVE mg/dL   Nitrite NEGATIVE NEGATIVE   Leukocytes,Ua NEGATIVE NEGATIVE    Comment: Performed at [facility] [address], [place], [place] [address]   DG Chest 2 View  Result Date:  04/23/2020 CLINICAL DATA:  Shortness of breath EXAM: CHEST - 2 VIEW COMPARISON:  April 23, 2020 chest radiograph obtained earlier in the day and chest CT April 23, 2020 FINDINGS: Pneumothorax on the left has increased in size with increase in subcutaneous air. No tension component. There is bibasilar atelectasis. Heart size and pulmonary vascularity normal. No adenopathy. There are several displaced rib fractures on the left. IMPRESSION: Pneumothorax slightly larger on the left with extensive subcutaneous air. Several displaced rib fractures on the left. Bibasilar atelectasis. Critical Value/emergent results were called by telephone at the time of interpretation on 04/23/2020 at 8:53 pm to provider [facility] , who verbally acknowledged these results. Electronically Signed   By: Lowella Grip III M.D.   On: 04/23/2020 20:53   DG Ribs Unilateral W/Chest Left  Result Date: 04/23/2020 CLINICAL DATA:  Left-sided chest pain after fall. EXAM: LEFT RIBS AND CHEST - 3+ VIEW COMPARISON:  None. FINDINGS: The patient is rotated to the right. There are multiple acute minimally displaced left-sided rib fractures involving the fourth and sixth through tenth ribs. There is a small left pneumothorax. Subcutaneous emphysema in the left chest wall and left lower neck. The heart size and mediastinal contours are within normal limits. Low lung volumes. Mild bibasilar atelectasis. No pleural effusion. IMPRESSION: 1. Multiple acute minimally displaced left-sided rib fractures with small left pneumothorax. Electronically Signed   By: Titus Dubin M.D.   On: 04/23/2020 15:28   CT CHEST ABDOMEN PELVIS W CONTRAST  Result Date: 04/23/2020 CLINICAL DATA:  Chest wall pain.  Left-sided pain. EXAM: CT CHEST, ABDOMEN, AND PELVIS WITH CONTRAST TECHNIQUE: Multidetector CT imaging of the chest, abdomen and pelvis was performed following the standard protocol during bolus administration of intravenous contrast. CONTRAST:  138mL OMNIPAQUE  IOHEXOL 300 MG/ML  SOLN COMPARISON:  None. FINDINGS: CT CHEST FINDINGS Cardiovascular: The heart size is unremarkable. There is no large pericardial effusion. No evidence for thoracic aortic aneurysm or dissection. The arch vessels are patent where visualized. Mediastinum/Nodes: -- No mediastinal lymphadenopathy. -- No hilar lymphadenopathy. -- No axillary lymphadenopathy. -- No supraclavicular lymphadenopathy. -- Normal thyroid gland where visualized. -  Unremarkable esophagus. Lungs/Pleura: There is a moderate-sized left-sided pneumothorax. There is a small left-sided pleural effusion. There is atelectasis at the left lung base. There are probable pulmonary contusions involving both the left upper and left lower lobe. There is suggestion of a laceration involving the left lower lobe (axial series 6, image 99). There is atelectasis involving the right lung. No right-sided pneumothorax. There is extensive subcutaneous gas along the patient's left flank extending to the left neck. Musculoskeletal: There multiple displaced left-sided rib fractures involving the left fourth rib laterally in addition to the left sixth through tenth ribs. The left 6, eighth, ninth, and tenth ribs are fractured both laterally and posteriorly. There is significant displacement of the see eighth and ninth rib fractures. CT ABDOMEN PELVIS FINDINGS Hepatobiliary: The liver is normal. Normal gallbladder.There is no biliary ductal dilation. Pancreas: Normal contours without ductal dilatation. No peripancreatic fluid collection. Spleen: Unremarkable. Adrenals/Urinary Tract: --Adrenal glands: Unremarkable. --Right kidney/ureter: No hydronephrosis or radiopaque kidney stones. --Left kidney/ureter: No hydronephrosis or radiopaque kidney stones. --Urinary bladder: Unremarkable. Stomach/Bowel: --Stomach/Duodenum: No hiatal hernia or other  gastric abnormality. Normal duodenal course and caliber. --Small bowel: Unremarkable. --Colon: There is scattered  colonic diverticula without CT evidence for diverticulitis. --Appendix: Normal. Vascular/Lymphatic: Normal course and caliber of the major abdominal vessels. --No retroperitoneal lymphadenopathy. --No mesenteric lymphadenopathy. --No pelvic or inguinal lymphadenopathy. Reproductive: Unremarkable Other: No ascites or free air. The abdominal wall is normal. Musculoskeletal. No acute displaced fractures. IMPRESSION: 1. Moderate-sized left-sided pneumothorax. Pulmonary contusions and a probable laceration involving the left lower lobe are noted. 2. Small left-sided pleural effusion. 3. Multiple displaced left-sided rib fractures are noted as detailed above. 4. Scattered colonic diverticula without CT evidence for diverticulitis. Electronically Signed   By: Constance Holster M.D.   On: 04/23/2020 19:00   DG Hip Unilat W or Wo Pelvis 2-3 Views Left  Result Date: 04/23/2020 CLINICAL DATA:  Left hip pain after fall. EXAM: DG HIP (WITH OR WITHOUT PELVIS) 2-3V LEFT COMPARISON:  CT abdomen pelvis dated June 05, 2006. FINDINGS: There is no evidence of hip fracture or dislocation. There is no evidence of arthropathy or other focal bone abnormality. IMPRESSION: Negative. Electronically Signed   By: Titus Dubin M.D.   On: 04/23/2020 15:29    Review of Systems  Constitutional: Negative.   HENT: Negative.   Eyes: Negative.   Respiratory: Positive for chest tightness and shortness of breath.   Cardiovascular: Positive for chest pain.  Gastrointestinal: Positive for diarrhea (chronic issues with diarrhea, tx by Buccini for IBS).  Endocrine: Negative.   Genitourinary: Negative.   Musculoskeletal: Negative.   Skin: Negative.   Allergic/Immunologic: Negative.   Neurological: Negative.   Hematological: Negative.   Psychiatric/Behavioral: Negative.     Blood pressure 128/89, pulse 93, temperature 99 F (37.2 C), temperature source Oral, resp. rate (!) 24, height 5\' 10"  (1.778 m), weight 83.9 kg, SpO2 95  %. Physical Exam Constitutional:      General: He is in acute distress.     Appearance: Normal appearance. He is normal weight. He is diaphoretic (during end of chest tube placement). He is not ill-appearing.  HENT:     Head: Normocephalic and atraumatic.     Right Ear: External ear normal.     Left Ear: External ear normal.     Nose: Nose normal.     Mouth/Throat:     Mouth: Mucous membranes are moist.     Pharynx: Posterior oropharyngeal erythema present. No oropharyngeal exudate.  Eyes:     General: No scleral icterus.       Right eye: No discharge.        Left eye: No discharge.     Extraocular Movements: Extraocular movements intact.     Pupils: Pupils are equal, round, and reactive to light.  Cardiovascular:     Rate and Rhythm: Normal rate and regular rhythm.     Pulses: Normal pulses.  Pulmonary:     Effort: Respiratory distress present.     Comments: Breathing more frequently and more shallow than normal.  Left lateral chest exquisitely tender Chest:     Chest wall: Tenderness present.  Abdominal:     General: Abdomen is flat. There is no distension.     Palpations: Abdomen is soft.     Tenderness: There is no abdominal tenderness.  Musculoskeletal:        General: No swelling, tenderness, deformity or signs of injury.     Cervical back: Normal range of motion and neck supple. No rigidity or tenderness.     Right lower leg: No edema.  Left lower leg: No edema.     Comments: Left chest hurts too much to fully move left arm/shoulder  Lymphadenopathy:     Cervical: No cervical adenopathy.  Skin:    General: Skin is warm.     Capillary Refill: Capillary refill takes less than 2 seconds.  Neurological:     General: No focal deficit present.     Mental Status: He is alert and oriented to person, place, and time.     Motor: No weakness.  Psychiatric:        Mood and Affect: Mood normal.        Behavior: Behavior normal.        Thought Content: Thought content  normal.        Judgment: Judgment normal.     Assessment/Plan Left rib fractures 4, 6-10 with left 6, 8-10 broken in two places with displacement of 8,9 Left pneumothorax secondary to above Left pulmonary contusion and probable left lower lobe lung laceration Hyperglycemia- presumably stress related IBS  Left chest tube placed - see separate note. Repeat left cxr to confirm tube  Admit to tele bed tonight with continuous pulse ox.  May be able to have regular bed tomorrow if stable overnight.  Multimodal pain control with tylenol, ibuprofen, muscle relaxants, oxycodone and fentanyl  Incentive spirometry     Almond Lint 04/23/2020, 10:51 PM   Procedures

## 2020-04-23 NOTE — ED Triage Notes (Signed)
Pt bib ems, pt was jumping a fence, landed on his left side, pt c.o severe pain to his left ribcage, left hip/flank. Pt given fentanyl total en route. Pt alert, BP 135/80

## 2020-04-23 NOTE — ED Provider Notes (Addendum)
[facility] EMERGENCY DEPARTMENT Provider Note   CSN: [id number] Arrival date & time: 04/23/20  1144     History Chief Complaint  Patient presents with  . Fall    Christopher Bell is a 60 y.o. male.  60 y.o male with a PMH of pancreatitis presents to the ED with a chief complaint of left side pain s/p injury. Patient left side pain with swelling to the area. Patient attempted to jump a plastic fence reports his foot got caught at he fell landing on the left side of his flank. Reports pain to the area worse with movement and breathing better with rest. Given 250 mcg of fentanyl by EMS with improvement in symptoms. Reports no chest pain, no loss of consciousness, or other complaints.      The history is provided by the patient.  Fall This is a new problem. The current episode started 3 to 5 hours ago. The problem occurs rarely. The problem has been gradually worsening. Associated symptoms include shortness of breath. Pertinent negatives include no chest pain, no abdominal pain and no headaches. The symptoms are aggravated by walking and twisting. The symptoms are relieved by lying down. He has tried nothing for the symptoms.       Past Medical History:  Diagnosis Date  . GERD (gastroesophageal reflux disease)     Patient Active Problem List   Diagnosis Date Noted  . Closed traumatic fracture of ribs of left side with pneumothorax 04/23/2020  . Irritable bowel syndrome (IBS) 09/20/2019  . Upper respiratory tract infection 04/20/2018  . HSV-2 infection 08/24/2016  . Numerous moles 11/04/2014  . Preventative health care 11/04/2014  . Erectile dysfunction 04/24/2013  . Motion sickness 04/24/2013  . BARRETTS ESOPHAGUS 05/06/2007  . PANCREATITIS, HX OF 01/30/2007  . Personal history of other diseases of digestive system 01/30/2007    History reviewed. No pertinent surgical history.     No family history on file.  Social History   Tobacco Use  . Smoking  status: Never Smoker  . Smokeless tobacco: Never Used  Substance Use Topics  . Alcohol use: No  . Drug use: No    Home Medications Prior to Admission medications   Medication Sig Start Date End Date Taking? Authorizing Provider  ascorbic acid (VITAMIN C) 100 MG tablet Take 100 mg by mouth daily.   Yes [provider]  GLUCOSAMINE HCL PO Take 1 tablet by mouth daily.   Yes [provider]  Multiple Vitamin (MULTI-VITAMIN) tablet Take 1 tablet by mouth daily.   Yes [provider]  Omega-3 1000 MG CAPS Take 1,000 mg by mouth daily.   Yes [provider]  omeprazole (PRILOSEC) 20 MG capsule Take 1-2 capsules (20-40 mg total) by mouth daily. Patient taking differently: Take 20 mg by mouth daily. 04/17/20  Yes Roma Schanz R, DO  loperamide (IMODIUM A-D) 2 MG tablet Take 2 tablets (4 mg total) by mouth 3 (three) times daily. Patient not taking: No sig reported 09/20/19   Carollee Herter, Alferd Apa, DO  valACYclovir (VALTREX) 1000 MG tablet Take 1 tablet (1,000 mg total) by mouth daily. Patient not taking: No sig reported 09/27/19   Ann Held, DO    Allergies    Patient has no known allergies.  Review of Systems   Review of Systems  Constitutional: Negative for fever.  HENT: Negative for sore throat.   Respiratory: Positive for shortness of breath.   Cardiovascular: Negative for chest  pain.  Gastrointestinal: Negative for abdominal pain, nausea and vomiting.  Genitourinary: Positive for flank pain.  Musculoskeletal: Positive for back pain and myalgias.  Skin: Negative for pallor and wound.  Neurological: Negative for light-headedness and headaches.  All other systems reviewed and are negative.   Physical Exam Updated Vital Signs BP 131/82   Pulse 84   Temp 98.7 F (37.1 C) (Oral)   Resp 20   Ht 5' 10"  (1.778 m)   Wt 83.9 kg   SpO2 98%   BMI 26.54 kg/m   Physical Exam Vitals and nursing note reviewed.  Constitutional:       Appearance: Normal appearance.  HENT:     Head: Normocephalic and atraumatic.     Nose: Nose normal.     Mouth/Throat:     Mouth: Mucous membranes are moist.  Cardiovascular:     Rate and Rhythm: Normal rate.  Pulmonary:     Effort: Tachypnea present.     Breath sounds: Normal air entry. Decreased breath sounds present.       Comments: subcutaneous air noted to the left flank. No bruising noted.  Abdominal:     General: Abdomen is flat.     Palpations: Abdomen is soft.     Tenderness: There is no abdominal tenderness. There is no right CVA tenderness or left CVA tenderness.  Musculoskeletal:     Cervical back: Normal range of motion and neck supple.  Skin:    General: Skin is warm and dry.     Findings: Erythema present.  Neurological:     Mental Status: He is alert and oriented to person, place, and time.     ED Results / Procedures / Treatments   Labs (all labs ordered are listed, but only abnormal results are displayed) Labs Reviewed  CBC WITH DIFFERENTIAL/PLATELET - Abnormal; Notable for the following components:      Result Value   WBC 17.3 (*)    Neutro Abs 15.9 (*)    All other components within normal limits  COMPREHENSIVE METABOLIC PANEL - Abnormal; Notable for the following components:   Glucose, Bld 153 (*)    All other components within normal limits  URINALYSIS, ROUTINE W REFLEX MICROSCOPIC - Abnormal; Notable for the following components:   Ketones, ur 20 (*)    All other components within normal limits  CBC - Abnormal; Notable for the following components:   WBC 13.2 (*)    HCT 38.6 (*)    All other components within normal limits  CBC - Abnormal; Notable for the following components:   WBC 11.2 (*)    All other components within normal limits  I-STAT CHEM 8, ED - Abnormal; Notable for the following components:   Glucose, Bld 149 (*)    All other components within normal limits  SARS CORONAVIRUS 2 (TAT 6-24 HRS)  ETHANOL  LACTIC ACID, PLASMA   PROTIME-INR  HIV ANTIBODY (ROUTINE TESTING W REFLEX)  CREATININE, SERUM  SAMPLE TO BLOOD BANK    EKG None  Radiology DG Chest 2 View  Result Date: 04/23/2020 CLINICAL DATA:  Shortness of breath EXAM: CHEST - 2 VIEW COMPARISON:  April 23, 2020 chest radiograph obtained earlier in the day and chest CT April 23, 2020 FINDINGS: Pneumothorax on the left has increased in size with increase in subcutaneous air. No tension component. There is bibasilar atelectasis. Heart size and pulmonary vascularity normal. No adenopathy. There are several displaced rib fractures on the left. IMPRESSION: Pneumothorax slightly larger on the left  with extensive subcutaneous air. Several displaced rib fractures on the left. Bibasilar atelectasis. Critical Value/emergent results were called by telephone at the time of interpretation on 04/23/2020 at 8:53 pm to provider [facility] , who verbally acknowledged these results. Electronically Signed   By: Lowella Grip III M.D.   On: 04/23/2020 20:53   DG Ribs Unilateral W/Chest Left  Result Date: 04/23/2020 CLINICAL DATA:  Left-sided chest pain after fall. EXAM: LEFT RIBS AND CHEST - 3+ VIEW COMPARISON:  None. FINDINGS: The patient is rotated to the right. There are multiple acute minimally displaced left-sided rib fractures involving the fourth and sixth through tenth ribs. There is a small left pneumothorax. Subcutaneous emphysema in the left chest wall and left lower neck. The heart size and mediastinal contours are within normal limits. Low lung volumes. Mild bibasilar atelectasis. No pleural effusion. IMPRESSION: 1. Multiple acute minimally displaced left-sided rib fractures with small left pneumothorax. Electronically Signed   By: Titus Dubin M.D.   On: 04/23/2020 15:28   CT CHEST ABDOMEN PELVIS W CONTRAST  Result Date: 04/23/2020 CLINICAL DATA:  Chest wall pain.  Left-sided pain. EXAM: CT CHEST, ABDOMEN, AND PELVIS WITH CONTRAST TECHNIQUE: Multidetector CT  imaging of the chest, abdomen and pelvis was performed following the standard protocol during bolus administration of intravenous contrast. CONTRAST:  158m OMNIPAQUE IOHEXOL 300 MG/ML  SOLN COMPARISON:  None. FINDINGS: CT CHEST FINDINGS Cardiovascular: The heart size is unremarkable. There is no large pericardial effusion. No evidence for thoracic aortic aneurysm or dissection. The arch vessels are patent where visualized. Mediastinum/Nodes: -- No mediastinal lymphadenopathy. -- No hilar lymphadenopathy. -- No axillary lymphadenopathy. -- No supraclavicular lymphadenopathy. -- Normal thyroid gland where visualized. -  Unremarkable esophagus. Lungs/Pleura: There is a moderate-sized left-sided pneumothorax. There is a small left-sided pleural effusion. There is atelectasis at the left lung base. There are probable pulmonary contusions involving both the left upper and left lower lobe. There is suggestion of a laceration involving the left lower lobe (axial series 6, image 99). There is atelectasis involving the right lung. No right-sided pneumothorax. There is extensive subcutaneous gas along the patient's left flank extending to the left neck. Musculoskeletal: There multiple displaced left-sided rib fractures involving the left fourth rib laterally in addition to the left sixth through tenth ribs. The left 6, eighth, ninth, and tenth ribs are fractured both laterally and posteriorly. There is significant displacement of the see eighth and ninth rib fractures. CT ABDOMEN PELVIS FINDINGS Hepatobiliary: The liver is normal. Normal gallbladder.There is no biliary ductal dilation. Pancreas: Normal contours without ductal dilatation. No peripancreatic fluid collection. Spleen: Unremarkable. Adrenals/Urinary Tract: --Adrenal glands: Unremarkable. --Right kidney/ureter: No hydronephrosis or radiopaque kidney stones. --Left kidney/ureter: No hydronephrosis or radiopaque kidney stones. --Urinary bladder: Unremarkable.  Stomach/Bowel: --Stomach/Duodenum: No hiatal hernia or other gastric abnormality. Normal duodenal course and caliber. --Small bowel: Unremarkable. --Colon: There is scattered colonic diverticula without CT evidence for diverticulitis. --Appendix: Normal. Vascular/Lymphatic: Normal course and caliber of the major abdominal vessels. --No retroperitoneal lymphadenopathy. --No mesenteric lymphadenopathy. --No pelvic or inguinal lymphadenopathy. Reproductive: Unremarkable Other: No ascites or free air. The abdominal wall is normal. Musculoskeletal. No acute displaced fractures. IMPRESSION: 1. Moderate-sized left-sided pneumothorax. Pulmonary contusions and a probable laceration involving the left lower lobe are noted. 2. Small left-sided pleural effusion. 3. Multiple displaced left-sided rib fractures are noted as detailed above. 4. Scattered colonic diverticula without CT evidence for diverticulitis. Electronically Signed   By: CConstance HolsterM.D.   On: 04/23/2020 19:00  DG Chest Port 1 View  Result Date: 04/24/2020 CLINICAL DATA:  Closed traumatic fracture of ribs on the left with pneumothorax EXAM: PORTABLE CHEST 1 VIEW COMPARISON:  Yesterday FINDINGS: Left chest tube in stable position. Trace left apical pneumothorax. Similar degree of chest wall emphysema. Atelectasis at the lung bases. Normal heart size and aortic contours for technique. IMPRESSION: 1. Unchanged trace left apical pneumothorax. 2. Atelectasis at the lung bases. Electronically Signed   By: Monte Fantasia M.D.   On: 04/24/2020 04:35   DG CHEST PORT 1 VIEW  Result Date: 04/23/2020 CLINICAL DATA:  Status post trauma. EXAM: PORTABLE CHEST 1 VIEW COMPARISON:  April 23, 2020 (8:29 p.m.) FINDINGS: Since the prior study there is been interval placement of a left-sided chest tube. Its distal tip is seen overlying the medial aspect of the upper left lung, adjacent to the aortic arch. Mild, stable areas of atelectasis and/or early infiltrate are  seen within the bilateral lung bases. There is no evidence of a pleural effusion. A small residual left apical pneumothorax is seen. The heart size and mediastinal contours are within normal limits. Multiple left-sided rib fractures are seen. Stable moderate severity subcutaneous emphysema is seen involving the soft tissues of the left neck and along the lateral aspect of the lower left chest wall. IMPRESSION: 1. Interval left-sided chest tube placement positioning, as described above, with a small residual left apical pneumothorax. Electronically Signed   By: Virgina Norfolk M.D.   On: 04/23/2020 23:11   DG Hip Unilat W or Wo Pelvis 2-3 Views Left  Result Date: 04/23/2020 CLINICAL DATA:  Left hip pain after fall. EXAM: DG HIP (WITH OR WITHOUT PELVIS) 2-3V LEFT COMPARISON:  CT abdomen pelvis dated June 05, 2006. FINDINGS: There is no evidence of hip fracture or dislocation. There is no evidence of arthropathy or other focal bone abnormality. IMPRESSION: Negative. Electronically Signed   By: Titus Dubin M.D.   On: 04/23/2020 15:29    Procedures .Critical Care Performed by: Janeece Fitting, PA-C Authorized by: Janeece Fitting, PA-C   Critical care provider statement:    Critical care time (minutes):  40   Critical care start time:  04/24/2020 12:05 PM   Critical care end time:  04/24/2020 12:45 PM   Critical care time was exclusive of:  Separately billable procedures and treating other patients   Critical care was necessary to treat or prevent imminent or life-threatening deterioration of the following conditions:  Trauma   Critical care was time spent personally by me on the following activities:  Blood draw for specimens, development of treatment plan with patient or surrogate, discussions with consultants, evaluation of patient's response to treatment, examination of patient, obtaining history from patient or surrogate, ordering and performing treatments and interventions, ordering and review of  laboratory studies, ordering and review of radiographic studies, pulse oximetry, re-evaluation of patient's condition and review of old charts   (including critical care time)  Medications Ordered in ED Medications  fentaNYL (SUBLIMAZE) injection 50-100 mcg (50 mcg Intravenous Given 04/24/20 1106)  valACYclovir (VALTREX) tablet 1,000 mg (1,000 mg Oral Patient Refused/Not Given 04/24/20 1105)  loperamide (IMODIUM) capsule 4 mg (has no administration in time range)  pantoprazole (PROTONIX) EC tablet 40 mg (40 mg Oral Given 04/24/20 1109)  enoxaparin (LOVENOX) injection 30 mg (has no administration in time range)  sodium chloride flush (NS) 0.9 % injection 3 mL (3 mLs Intravenous Given 04/23/20 2340)  sodium chloride flush (NS) 0.9 % injection 3 mL (has no  administration in time range)  0.9 %  sodium chloride infusion (has no administration in time range)  ondansetron (ZOFRAN-ODT) disintegrating tablet 4 mg (has no administration in time range)    Or  ondansetron (ZOFRAN) injection 4 mg (has no administration in time range)  prochlorperazine (COMPAZINE) tablet 10 mg (has no administration in time range)    Or  prochlorperazine (COMPAZINE) injection 5-10 mg (has no administration in time range)  diphenhydrAMINE (BENADRYL) capsule 25 mg (has no administration in time range)  acetaminophen (TYLENOL) tablet 1,000 mg (1,000 mg Oral Given 04/24/20 1103)  ibuprofen (ADVIL) tablet 600 mg (has no administration in time range)  methocarbamol (ROBAXIN) tablet 1,000 mg (1,000 mg Oral Given 04/24/20 1103)  lidocaine (LIDODERM) 5 % 1 patch (1 patch Transdermal Patch Applied 04/24/20 1112)  oxyCODONE (Oxy IR/ROXICODONE) immediate release tablet 10 mg (has no administration in time range)  fentaNYL (SUBLIMAZE) injection 100 mcg (100 mcg Intravenous Given 04/23/20 1502)  sodium chloride 0.9 % bolus 1,000 mL (0 mLs Intravenous Stopped 04/23/20 1908)  morphine 4 MG/ML injection 4 mg (4 mg Intravenous Given 04/23/20 1606)   ondansetron (ZOFRAN) injection 4 mg (4 mg Intravenous Given 04/23/20 1606)  iohexol (OMNIPAQUE) 300 MG/ML solution 100 mL (100 mLs Intravenous Contrast Given 04/23/20 1845)  morphine 4 MG/ML injection 4 mg (4 mg Intravenous Given 04/23/20 1912)  HYDROmorphone (DILAUDID) injection 1 mg (1 mg Intravenous Given 04/23/20 2014)  acetaminophen (TYLENOL) tablet 650 mg (650 mg Oral Given 04/23/20 2116)  lidocaine (PF) (XYLOCAINE) 1 % injection 30 mL (30 mLs Intradermal Given by Other 04/23/20 2127)  fentaNYL (SUBLIMAZE) injection 100 mcg (100 mcg Intravenous Given 04/23/20 2251)    ED Course  I have reviewed the triage vital signs and the nursing notes.  Pertinent labs & imaging results that were available during my care of the patient were reviewed by me and considered in my medical decision making (see chart for details).  Clinical Course as of 04/24/20 1205  Wed Apr 24, 6163  [id number] 61 year old male complaining of left-sided chest wall pain after a fall jumping a fence.  Pain is severe in nature worse with any cough deep breathing rotation.  He has some crepitus on exam.  Chest x-ray shows rib fractures and chest CT shows multiple rib fractures and a moderate pneumo.  Trauma has been consulted.  Anticipate admission. [MB]    Clinical Course User Index [MB] Hayden Rasmussen, MD   MDM Rules/Calculators/A&P   Patient with no PMH presents to the ED s/p left side pain after falling and landing on the left side of his flank onto a plastic band.  Reports pain to the area, pain is exacerbated with deep breathing, feels like he is unable to take a very deep breath.  Given 250 mics of fentanyl by EMS with improvement in his symptoms.  He arrived in the ED with stable vital signs, remains hemodynamically stable.  Tachypnea present during evaluation, there is subcutaneous free air on the left leg, per family palpation of the left posterior ribs.  No bruising noted to the area.  Abdomen is soft, there is pain with  palpation of the left upper quadrant but no bruising is visualized.  Lungs are diminished to auscultation on the left lower base.  Rest of his exam is benign.  Reports no loss of consciousness, currently not on any blood thinners, no headache or neck pain.  X-ray of his left chest and ribs showed:  1. Multiple acute minimally displaced left-sided rib  fractures with  small left pneumothorax.   Due to extensive rib fractures will obtain further imaging to rule out any intra-abdominal injury, he is currently not on any blood thinners.  He does report a prior history of substance abuse.  Interpretation of his labs reveal a CBC with a leukocytosis of 17.3, hemoglobin remained stable at this time.  CMP without any electrolyte derangement, creatinine level is unremarkable.  LFTs are within normal limits.  Lactic acid remains normal.  Covid test has been ordered for patient at this time.  UA is currently pending.  CT chest/abd showed: 1. Moderate-sized left-sided pneumothorax. Pulmonary contusions and  a probable laceration involving the left lower lobe are noted.  2. Small left-sided pleural effusion.  3. Multiple displaced left-sided rib fractures are noted as detailed  above.  4. Scattered colonic diverticula without CT evidence for  diverticulitis.     7:09 PM Multiple rounds of morphine were given.  For pain control, he was also given Zofran to help with nausea along with a liter of fluids.  Due to extensive injuries along with neck pain.  Will consult trauma surgery for further disposition.  Spoke to Dr. Barry Dienes from trauma surgery who requested repeat x-ray at this time.  He will likely need a chest tube placement.  Patient's Covid test continues pending.   8:55 PM Called received from Radiologist Dr. Jasmine December  Pneumothorax on the left has increased in size with increase in  subcutaneous air. No tension component. There is bibasilar  atelectasis. Heart size and pulmonary vascularity normal.  No  adenopathy. There are several displaced rib fractures on the left.   9:04 PM Spoke to Dr. Barry Dienes, who has requested central kit at the bedside.  She will be placing chest tube along with admitting patient for further management.  Christopher Bell was evaluated in Emergency Department on 04/24/2020 for the symptoms described in the history of present illness. He was evaluated in the context of the global COVID-19 pandemic, which necessitated consideration that the patient might be at risk for infection with the SARS-CoV-2 virus that causes COVID-19. Institutional protocols and algorithms that pertain to the evaluation of patients at risk for COVID-19 are in a state of rapid change based on information released by regulatory bodies including the CDC and federal and state organizations. These policies and algorithms were followed during the patient's care in the ED.   Portions of this note were generated with [employer]. Dictation errors may occur despite best attempts at proofreading.  Final Clinical Impression(s) / ED Diagnoses Final diagnoses:  Fall, initial encounter  Closed fracture of multiple ribs of left side, initial encounter  Closed traumatic fracture of ribs of left side with pneumothorax    Rx / DC Orders ED Discharge Orders    None       Corinna Capra 04/23/20 2113    Truddie Hidden, MD 04/24/20 0656    Janeece Fitting, PA-C 04/24/20 1205    Truddie Hidden, MD 04/24/20 1451

## 2020-04-23 NOTE — Op Note (Signed)
Left chest Tube Insertion Procedure Note  Indications:  Clinically significant left Pneumothorax  Pre-operative Diagnosis: Pneumothorax  Post-operative Diagnosis: Pneumothorax  Procedure Details  Informed consent was obtained for the procedure.    After sterile skin prep, using standard seldinger technique, a 14 French pigtail chest tube was placed in the left anterolateral 5th rib space. This was secured with 3-0 silk x 2 and dressed with vaseline gauze and tape.  It was secured to a pleurovac at -20 cm H2O suction without air leak seen.    Findings: Small rush of air  Estimated Blood Loss:  Minimal         Specimens:  None              Complications:  None; patient tolerated the procedure well.         Disposition: remains in ED         Condition: stable  Attending Attestation: I performed the procedure.

## 2020-04-24 ENCOUNTER — Inpatient Hospital Stay (HOSPITAL_COMMUNITY): Payer: BC Managed Care – PPO

## 2020-04-24 LAB — CREATININE, SERUM
Creatinine, Ser: 0.98 mg/dL (ref 0.61–1.24)
GFR, Estimated: >60 mL/min (ref >60)

## 2020-04-24 LAB — HIV ANTIBODY (ROUTINE TESTING W REFLEX): HIV Screen 4th Generation wRfx: NONREACTIVE

## 2020-04-24 LAB — CBC
HCT: 40.4 % (ref 39.0–52.0)
Hemoglobin: 13.4 g/dL (ref 13.0–17.0)
MCH: 29.8 pg (ref 26.0–34.0)
MCHC: 33.2 g/dL (ref 30.0–36.0)
MCV: 89.8 fL (ref 80.0–100.0)
Platelets: 268 10*3/uL (ref 150–400)
RBC: 4.5 MIL/uL (ref 4.22–5.81)
RDW: 12 % (ref 11.5–15.5)
WBC: 11.2 10*3/uL — ABNORMAL HIGH (ref 4.0–10.5)
nRBC: 0 % (ref 0.0–0.2)

## 2020-04-24 LAB — SARS CORONAVIRUS 2 (TAT 6-24 HRS): SARS Coronavirus 2: NEGATIVE

## 2020-04-24 MED ORDER — OXYCODONE HCL 5 MG PO TABS
10.0000 mg | ORAL_TABLET | ORAL | Status: DC | PRN
  Administered 2020-04-24 – 2020-04-25 (×4): 10 mg via ORAL
  Filled 2020-04-24 (×4): qty 2

## 2020-04-24 MED ORDER — METHOCARBAMOL 500 MG PO TABS
1000.0000 mg | ORAL_TABLET | Freq: Three times a day (TID) | ORAL | Status: DC
  Administered 2020-04-24 – 2020-04-27 (×10): 1000 mg via ORAL
  Filled 2020-04-24 (×10): qty 2

## 2020-04-24 MED ORDER — ACETAMINOPHEN 500 MG PO TABS
1000.0000 mg | ORAL_TABLET | Freq: Four times a day (QID) | ORAL | Status: DC
  Administered 2020-04-24 – 2020-04-27 (×12): 1000 mg via ORAL
  Filled 2020-04-24 (×12): qty 2

## 2020-04-24 MED ORDER — IBUPROFEN 600 MG PO TABS
600.0000 mg | ORAL_TABLET | Freq: Three times a day (TID) | ORAL | Status: DC
  Administered 2020-04-24 – 2020-04-27 (×9): 600 mg via ORAL
  Filled 2020-04-24 (×9): qty 1

## 2020-04-24 MED ORDER — LIDOCAINE 5 % EX PTCH
1.0000 | MEDICATED_PATCH | CUTANEOUS | Status: DC
  Administered 2020-04-24 – 2020-04-25 (×3): 1 via TRANSDERMAL
  Filled 2020-04-24 (×5): qty 1

## 2020-04-24 NOTE — Progress Notes (Signed)
Patient received into room 4E20 from ED. CHG done and skin assessment completed. Cardiac monitor placed and central telemetry notified. Vital signs taken and charted. Will continue to monitor.  -Estella Husk, RN

## 2020-04-24 NOTE — ED Notes (Signed)
Lunch Tray Ordered @ 1114.  

## 2020-04-24 NOTE — Progress Notes (Signed)
[facility] Progress Note     Subjective: CC:  C/o anterior and posterior chest wall pain, worse with deep inspiration. Denies abdominal or extremity pain. Tolerating PO without N/V. 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 Denies history of tobacco or other drug use.   Objective: Vital signs in last 24 hours: Temp:  [97.7 F (36.5 C)-100.1 F (37.8 C)] 98.7 F (37.1 C) (01/06 0720) Pulse Rate:  [68-96] 68 (01/06 0656) Resp:  [15-27] 20 (01/06 0656) BP: (108-140)/(66-95) 131/72 (01/06 0656) SpO2:  [91 %-100 %] 97 % (01/06 0656) Weight:  [83.9 kg] 83.9 kg (01/05 1454)    Intake/Output from previous day: 01/05 0701 - 01/06 0700 In: 1000 [IV Piggyback:1000] Out: -  Intake/Output this shift: No intake/output data recorded.  PE: Gen:  Alert, NAD, pleasant Card:  Regular rate and rhythm, pedal pulses 2+ BL Pulm:  Normal effort, splinting breathing due to pain, clear to auscultation bilaterally with diminished breath sounds bilateral bases. Left chest tube present but not hooked up to the wall correctly - re-connected to -20cm suction. Abd: Soft, non-tender, non-distended, bowel sounds present in all 4 quadrants, no HSM Skin: warm and dry, no rashes  Psych: A&Ox3   Lab Results:  Recent Labs    04/23/20 1528 04/23/20 1630 04/23/20 2331  WBC 17.3*  --  13.2*  HGB 14.1 13.9 13.2  HCT 40.6 41.0 38.6*  PLT 286  --  280   BMET Recent Labs    04/23/20 1528 04/23/20 1630 04/23/20 2331  NA 139 142  --   K 4.1 4.0  --   CL 105 103  --   CO2 24  --   --   GLUCOSE 153* 149*  --   BUN 10 11  --   CREATININE 0.95 0.80 0.98  CALCIUM 9.1  --   --    PT/INR Recent Labs    04/23/20 1528  LABPROT 13.0  INR 1.0   CMP     Component Value Date/Time   NA 142 04/23/2020 1630   K 4.0 04/23/2020 1630   CL 103 04/23/2020 1630   CO2 24 04/23/2020 1528   GLUCOSE 149 (H) 04/23/2020 1630   BUN 11 04/23/2020 1630    CREATININE 0.98 04/23/2020 2331   CREATININE 1.04 10/29/2016 1610   CALCIUM 9.1 04/23/2020 1528   PROT 7.5 04/23/2020 1528   ALBUMIN 4.2 04/23/2020 1528   AST 30 04/23/2020 1528   ALT 22 04/23/2020 1528   ALKPHOS 56 04/23/2020 1528   BILITOT 0.9 04/23/2020 1528   GFRNONAA >60 04/23/2020 2331   GFRAA  01/25/2009 1440    >60        The eGFR has been calculated using the MDRD equation. This calculation has not been validated in all clinical situations. eGFR's persistently <60 mL/min signify possible Chronic Kidney Disease.   Lipase     Component Value Date/Time   LIPASE 38 01/25/2009 1440       Studies/Results: DG Chest 2 View  Result Date: 04/23/2020 CLINICAL DATA:  Shortness of breath EXAM: CHEST - 2 VIEW COMPARISON:  April 23, 2020 chest radiograph obtained earlier in the day and chest CT April 23, 2020 FINDINGS: Pneumothorax on the left has increased in size with increase in subcutaneous air. No tension component. There is bibasilar atelectasis. Heart size and pulmonary vascularity normal. No adenopathy. There are several displaced rib fractures on the left. IMPRESSION: Pneumothorax slightly larger on  the left with extensive subcutaneous air. Several displaced rib fractures on the left. Bibasilar atelectasis. Critical Value/emergent results were called by telephone at the time of interpretation on 04/23/2020 at 8:53 pm to provider [facility] , who verbally acknowledged these results. Electronically Signed   By: Lowella Grip III M.D.   On: 04/23/2020 20:53   DG Ribs Unilateral W/Chest Left  Result Date: 04/23/2020 CLINICAL DATA:  Left-sided chest pain after fall. EXAM: LEFT RIBS AND CHEST - 3+ VIEW COMPARISON:  None. FINDINGS: The patient is rotated to the right. There are multiple acute minimally displaced left-sided rib fractures involving the fourth and sixth through tenth ribs. There is a small left pneumothorax. Subcutaneous emphysema in the left chest wall and left  lower neck. The heart size and mediastinal contours are within normal limits. Low lung volumes. Mild bibasilar atelectasis. No pleural effusion. IMPRESSION: 1. Multiple acute minimally displaced left-sided rib fractures with small left pneumothorax. Electronically Signed   By: Titus Dubin M.D.   On: 04/23/2020 15:28   CT CHEST ABDOMEN PELVIS W CONTRAST  Result Date: 04/23/2020 CLINICAL DATA:  Chest wall pain.  Left-sided pain. EXAM: CT CHEST, ABDOMEN, AND PELVIS WITH CONTRAST TECHNIQUE: Multidetector CT imaging of the chest, abdomen and pelvis was performed following the standard protocol during bolus administration of intravenous contrast. CONTRAST:  167mL OMNIPAQUE IOHEXOL 300 MG/ML  SOLN COMPARISON:  None. FINDINGS: CT CHEST FINDINGS Cardiovascular: The heart size is unremarkable. There is no large pericardial effusion. No evidence for thoracic aortic aneurysm or dissection. The arch vessels are patent where visualized. Mediastinum/Nodes: -- No mediastinal lymphadenopathy. -- No hilar lymphadenopathy. -- No axillary lymphadenopathy. -- No supraclavicular lymphadenopathy. -- Normal thyroid gland where visualized. -  Unremarkable esophagus. Lungs/Pleura: There is a moderate-sized left-sided pneumothorax. There is a small left-sided pleural effusion. There is atelectasis at the left lung base. There are probable pulmonary contusions involving both the left upper and left lower lobe. There is suggestion of a laceration involving the left lower lobe (axial series 6, image 99). There is atelectasis involving the right lung. No right-sided pneumothorax. There is extensive subcutaneous gas along the patient's left flank extending to the left neck. Musculoskeletal: There multiple displaced left-sided rib fractures involving the left fourth rib laterally in addition to the left sixth through tenth ribs. The left 6, eighth, ninth, and tenth ribs are fractured both laterally and posteriorly. There is significant  displacement of the see eighth and ninth rib fractures. CT ABDOMEN PELVIS FINDINGS Hepatobiliary: The liver is normal. Normal gallbladder.There is no biliary ductal dilation. Pancreas: Normal contours without ductal dilatation. No peripancreatic fluid collection. Spleen: Unremarkable. Adrenals/Urinary Tract: --Adrenal glands: Unremarkable. --Right kidney/ureter: No hydronephrosis or radiopaque kidney stones. --Left kidney/ureter: No hydronephrosis or radiopaque kidney stones. --Urinary bladder: Unremarkable. Stomach/Bowel: --Stomach/Duodenum: No hiatal hernia or other gastric abnormality. Normal duodenal course and caliber. --Small bowel: Unremarkable. --Colon: There is scattered colonic diverticula without CT evidence for diverticulitis. --Appendix: Normal. Vascular/Lymphatic: Normal course and caliber of the major abdominal vessels. --No retroperitoneal lymphadenopathy. --No mesenteric lymphadenopathy. --No pelvic or inguinal lymphadenopathy. Reproductive: Unremarkable Other: No ascites or free air. The abdominal wall is normal. Musculoskeletal. No acute displaced fractures. IMPRESSION: 1. Moderate-sized left-sided pneumothorax. Pulmonary contusions and a probable laceration involving the left lower lobe are noted. 2. Small left-sided pleural effusion. 3. Multiple displaced left-sided rib fractures are noted as detailed above. 4. Scattered colonic diverticula without CT evidence for diverticulitis. Electronically Signed   By: Constance Holster M.D.   On: 04/23/2020  19:00   DG Chest Port 1 View  Result Date: 04/24/2020 CLINICAL DATA:  Closed traumatic fracture of ribs on the left with pneumothorax EXAM: PORTABLE CHEST 1 VIEW COMPARISON:  Yesterday FINDINGS: Left chest tube in stable position. Trace left apical pneumothorax. Similar degree of chest wall emphysema. Atelectasis at the lung bases. Normal heart size and aortic contours for technique. IMPRESSION: 1. Unchanged trace left apical pneumothorax. 2.  Atelectasis at the lung bases. Electronically Signed   By: Monte Fantasia M.D.   On: 04/24/2020 04:35   DG CHEST PORT 1 VIEW  Result Date: 04/23/2020 CLINICAL DATA:  Status post trauma. EXAM: PORTABLE CHEST 1 VIEW COMPARISON:  April 23, 2020 (8:29 p.m.) FINDINGS: Since the prior study there is been interval placement of a left-sided chest tube. Its distal tip is seen overlying the medial aspect of the upper left lung, adjacent to the aortic arch. Mild, stable areas of atelectasis and/or early infiltrate are seen within the bilateral lung bases. There is no evidence of a pleural effusion. A small residual left apical pneumothorax is seen. The heart size and mediastinal contours are within normal limits. Multiple left-sided rib fractures are seen. Stable moderate severity subcutaneous emphysema is seen involving the soft tissues of the left neck and along the lateral aspect of the lower left chest wall. IMPRESSION: 1. Interval left-sided chest tube placement positioning, as described above, with a small residual left apical pneumothorax. Electronically Signed   By: Virgina Norfolk M.D.   On: 04/23/2020 23:11   DG Hip Unilat W or Wo Pelvis 2-3 Views Left  Result Date: 04/23/2020 CLINICAL DATA:  Left hip pain after fall. EXAM: DG HIP (WITH OR WITHOUT PELVIS) 2-3V LEFT COMPARISON:  CT abdomen pelvis dated June 05, 2006. FINDINGS: There is no evidence of hip fracture or dislocation. There is no evidence of arthropathy or other focal bone abnormality. IMPRESSION: Negative. Electronically Signed   By: Titus Dubin M.D.   On: 04/23/2020 15:29    Anti-infectives: Anti-infectives (From admission, onward)   Start     Dose/Rate Route Frequency Ordered Stop   04/24/20 1000  valACYclovir (VALTREX) tablet 1,000 mg        1,000 mg Oral Daily 04/23/20 2323       Assessment/Plan Left rib fractures 4, 6-10 with left 6, 8-10 broken in two places with displacement of 8,9 - multimodal pain control, IS/Pulm  toilet.  Left pneumothorax secondary to above - s/p pigtail CT 1/5, CXR this AM w/ trace apical PTX, continue - 20 cm suction. CXR in AM.  Left pulmonary contusion and probable left lower lobe lung laceration Hyperglycemia- presumably stress related, monitor IBS  FEN: Reg diet ID: none VTE: SCD's, Lovenox Foley: none Dispo: med-surg, CT -20 cm suction, pain control (scheduled non-narcotics, increased oxy to 10 mg), PT   LOS: 1 day    Obie Dredge, [facility] Please see Amion for pager number during day hours 7:00am-4:30pm

## 2020-04-24 NOTE — Evaluation (Signed)
Physical Therapy Evaluation Patient Details Name: Christopher Bell MRN: [id number] DOB: 07/22/1960 Today's Date: 04/24/2020   History of Present Illness  Pt is a 60 y/o male admitted after falling while jumping over a fence. Found to have L 4, 6-10 rib fractures, with displacement of 8 and 9. Also with L pneumothorax and is s/p chest tube. PMH includes pancreatitis.  Clinical Impression  Pt admitted secondary to problem above with deficits. Pt limited secondary to pain this session. Was able to stand at edge of stretcher and march in place with min guard A. Anticipate pt will progress well once pain controlled. Will likely not require follow up PT at d/c, however, will update based on pt's progression. Will continue to follow acutely.     Follow Up Recommendations Other (comment) (TBD pending progression)    Equipment Recommendations  Other (comment) (TBD pending progression)    Recommendations for Other Services       Precautions / Restrictions Precautions Precautions: Fall;Other (comment) Precaution Comments: chest tube Restrictions Weight Bearing Restrictions: No      Mobility  Bed Mobility Overal bed mobility: Needs Assistance Bed Mobility: Supine to Sit;Sit to Supine     Supine to sit: HOB elevated;Min guard Sit to supine: Min guard;HOB elevated   General bed mobility comments: Helicopter technique to perform bed mobility with HOB elevated. Educated about using log roll when bed flat to help with pain management. Min guard for safety and line management.    Transfers Overall transfer level: Needs assistance Equipment used: None Transfers: Sit to/from Stand Sit to Stand: Min guard         General transfer comment: Min guard for safety. Very guarded with increased pain. Practiced marching in place while standing at edge of stretcher. Further mobility deferred secondary to pain.  Ambulation/Gait                Stairs            Wheelchair Mobility     Modified Rankin (Stroke Patients Only)       Balance Overall balance assessment: Needs assistance Sitting-balance support: No upper extremity supported Sitting balance-Leahy Scale: Good     Standing balance support: No upper extremity supported Standing balance-Leahy Scale: Fair                               Pertinent Vitals/Pain Pain Assessment: 0-10 Pain Score: 9  Pain Location: L sided chest and ribs Pain Descriptors / Indicators: Constant;Grimacing;Guarding Pain Intervention(s): Limited activity within patient's tolerance;Monitored during session;Repositioned    Home Living Family/patient expects to be discharged to:: Private residence Living Arrangements: Spouse/significant other Available Help at Discharge: Family;Available 24 hours/day Type of Home: House Home Access: Stairs to enter Entrance Stairs-Rails: Doctor, general practice of Steps: 3 Home Layout: Two level Home Equipment: None      Prior Function Level of Independence: Independent               Hand Dominance        Extremity/Trunk Assessment   Upper Extremity Assessment Upper Extremity Assessment: Defer to OT evaluation    Lower Extremity Assessment Lower Extremity Assessment: Generalized weakness    Cervical / Trunk Assessment Cervical / Trunk Assessment: Other exceptions Cervical / Trunk Exceptions: s/p chest tube. L sided rib fxs  Communication   Communication: No difficulties  Cognition Arousal/Alertness: Awake/alert Behavior During Therapy: WFL for tasks assessed/performed Overall Cognitive Status: Within Functional Limits  for tasks assessed                                        General Comments      Exercises     Assessment/Plan    PT Assessment Patient needs continued PT services  PT Problem List Decreased activity tolerance;Decreased mobility;Decreased knowledge of precautions;Decreased safety awareness;Pain       PT  Treatment Interventions DME instruction;Gait training;Functional mobility training;Therapeutic activities;Therapeutic exercise;Balance training;Stair training;Patient/family education    PT Goals (Current goals can be found in the Care Plan section)  Acute Rehab PT Goals Patient Stated Goal: to decrease pain PT Goal Formulation: With patient Time For Goal Achievement: 05/08/20 Potential to Achieve Goals: Good    Frequency Min 3X/week   Barriers to discharge        Co-evaluation               AM-PAC PT "6 Clicks" Mobility  Outcome Measure Help needed turning from your back to your side while in a flat bed without using bedrails?: A Little Help needed moving from lying on your back to sitting on the side of a flat bed without using bedrails?: A Lot Help needed moving to and from a bed to a chair (including a wheelchair)?: A Little Help needed standing up from a chair using your arms (e.g., wheelchair or bedside chair)?: A Little Help needed to walk in hospital room?: A Little Help needed climbing 3-5 steps with a railing? : A Lot 6 Click Score: 16    End of Session Equipment Utilized During Treatment: Oxygen Activity Tolerance: Patient limited by pain Patient left: in bed;with call bell/phone within reach (on stretcher in ED) Nurse Communication: Mobility status PT Visit Diagnosis: Difficulty in walking, not elsewhere classified (R26.2);Pain Pain - Right/Left: Left Pain - part of body:  (chest and ribs)    Time: [id number] PT Time Calculation (min) (ACUTE ONLY): 18 min   Charges:   PT Evaluation $PT Eval Moderate Complexity: 1 Mod          Reuel Derby, PT, DPT  Acute Rehabilitation Services  Pager: [phone] Office: [phone]   Rudean Hitt 04/24/2020, 12:27 PM

## 2020-04-25 ENCOUNTER — Inpatient Hospital Stay (HOSPITAL_COMMUNITY): Payer: BC Managed Care – PPO

## 2020-04-25 LAB — GLUCOSE, CAPILLARY: Glucose-Capillary: 61 mg/dL — ABNORMAL LOW (ref 70–99)

## 2020-04-25 LAB — CBC
HCT: 38.7 % — ABNORMAL LOW (ref 39.0–52.0)
Hemoglobin: 12.8 g/dL — ABNORMAL LOW (ref 13.0–17.0)
MCH: 29.6 pg (ref 26.0–34.0)
MCHC: 33.1 g/dL (ref 30.0–36.0)
MCV: 89.4 fL (ref 80.0–100.0)
Platelets: 229 10*3/uL (ref 150–400)
RBC: 4.33 MIL/uL (ref 4.22–5.81)
RDW: 11.9 % (ref 11.5–15.5)
WBC: 9.5 10*3/uL (ref 4.0–10.5)
nRBC: 0 % (ref 0.0–0.2)

## 2020-04-25 MED ORDER — OXYCODONE HCL 5 MG PO TABS: 5.0000 mg | ORAL_TABLET | ORAL | Status: DC | PRN

## 2020-04-25 MED ORDER — OXYCODONE HCL 5 MG PO TABS
10.0000 mg | ORAL_TABLET | ORAL | Status: DC | PRN
  Administered 2020-04-25 (×2): 15 mg via ORAL
  Administered 2020-04-27: 10 mg via ORAL
  Filled 2020-04-25: qty 3
  Filled 2020-04-25: qty 2
  Filled 2020-04-25: qty 3

## 2020-04-25 MED ORDER — MORPHINE SULFATE (PF) 2 MG/ML IV SOLN
2.0000 mg | INTRAVENOUS | Status: DC | PRN
  Administered 2020-04-25 – 2020-04-26 (×4): 2 mg via INTRAVENOUS
  Filled 2020-04-25 (×4): qty 1

## 2020-04-25 NOTE — Progress Notes (Signed)
Subjective: CC: Patient complains of pain to his left ribs. No other ares of pain. Did not get out of bed yesterday. Tolerating diet without n/v or abdominal pain. Voiding. Lives at home with his wife. He works in Programmer, applications. 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 Denies history of tobacco or other drug use.   Objective: Vital signs in last 24 hours: Temp:  [97.9 F (36.6 C)-98.7 F (37.1 C)] 98.2 F (36.8 C) (01/07 0354) Pulse Rate:  [76-85] 77 (01/07 0354) Resp:  [16-22] 22 (01/07 0453) BP: (119-135)/(76-100) 124/83 (01/07 0354) SpO2:  [89 %-99 %] 95 % (01/07 0453) Last BM Date: 04/23/20 (stated)  Intake/Output from previous day: 01/06 0701 - 01/07 0700 In: 750 [P.O.:750] Out: 1220 [Urine:1120; Chest Tube:100] Intake/Output this shift: No intake/output data recorded.  PE: Gen:  Alert, NAD, pleasant Card:  Regular rate and rhythm, pedal pulses 2+ BL Pulm:  Normal rate and effort. On 2L o2. Clear to auscultation bilaterally. Left chest tube on -20cm suction. No air leak.  Abd: Soft, non-tender, non-distended, +BS Skin: warm and dry, no rashes  Psych: A&Ox3  Msk:   RUE: No gross deformities of joints or skin. Able passive shoulder, elbow, wrist and hand range of motion without pain.  No tenderness over shoulder, upper arm, elbow, forearm, wrists or hand. Radial 2+.  LUE: No gross deformities of joints or skin. Able passive shoulder, elbow, wrist and hand range of motion without pain.  No tenderness over shoulder, upper arm, elbow, forearm, wrists or hand. Radial 2+.  RLE: Able passive range of motion of hip, knee, ankle and all digits of the foot without pain.  No tenderness over hip, upper legs, knee, lower leg, ankle or feet.  No lower extremity edema.  No calf tenderness.   LLE: . Able passive range of motion of hip, knee, ankle and all digits of the foot without pain.  No tenderness over hip, upper legs, knee, lower leg, ankle or feet.  No lower extremity  edema.  No calf tenderness.      Lab Results:  Recent Labs    04/24/20 1105 04/25/20 0628  WBC 11.2* 9.5  HGB 13.4 12.8*  HCT 40.4 38.7*  PLT 268 229   BMET Recent Labs    04/23/20 1528 04/23/20 1630 04/23/20 2331  NA 139 142  --   K 4.1 4.0  --   CL 105 103  --   CO2 24  --   --   GLUCOSE 153* 149*  --   BUN 10 11  --   CREATININE 0.95 0.80 0.98  CALCIUM 9.1  --   --    PT/INR Recent Labs    04/23/20 1528  LABPROT 13.0  INR 1.0   CMP     Component Value Date/Time   NA 142 04/23/2020 1630   K 4.0 04/23/2020 1630   CL 103 04/23/2020 1630   CO2 24 04/23/2020 1528   GLUCOSE 149 (H) 04/23/2020 1630   BUN 11 04/23/2020 1630   CREATININE 0.98 04/23/2020 2331   CREATININE 1.04 10/29/2016 1610   CALCIUM 9.1 04/23/2020 1528   PROT 7.5 04/23/2020 1528   ALBUMIN 4.2 04/23/2020 1528   AST 30 04/23/2020 1528   ALT 22 04/23/2020 1528   ALKPHOS 56 04/23/2020 1528   BILITOT 0.9 04/23/2020 1528   GFRNONAA >60 04/23/2020 2331   GFRAA  01/25/2009 1440    >60  The eGFR has been calculated using the MDRD equation. This calculation has not been validated in all clinical situations. eGFR's persistently <60 mL/min signify possible Chronic Kidney Disease.   Lipase     Component Value Date/Time   LIPASE 38 01/25/2009 1440       Studies/Results: DG Chest 2 View  Result Date: 04/23/2020 CLINICAL DATA:  Shortness of breath EXAM: CHEST - 2 VIEW COMPARISON:  April 23, 2020 chest radiograph obtained earlier in the day and chest CT April 23, 2020 FINDINGS: Pneumothorax on the left has increased in size with increase in subcutaneous air. No tension component. There is bibasilar atelectasis. Heart size and pulmonary vascularity normal. No adenopathy. There are several displaced rib fractures on the left. IMPRESSION: Pneumothorax slightly larger on the left with extensive subcutaneous air. Several displaced rib fractures on the left. Bibasilar atelectasis. Critical  Value/emergent results were called by telephone at the time of interpretation on 04/23/2020 at 8:53 pm to provider [facility] , who verbally acknowledged these results. Electronically Signed   By: Lowella Grip III M.D.   On: 04/23/2020 20:53   DG Ribs Unilateral W/Chest Left  Result Date: 04/23/2020 CLINICAL DATA:  Left-sided chest pain after fall. EXAM: LEFT RIBS AND CHEST - 3+ VIEW COMPARISON:  None. FINDINGS: The patient is rotated to the right. There are multiple acute minimally displaced left-sided rib fractures involving the fourth and sixth through tenth ribs. There is a small left pneumothorax. Subcutaneous emphysema in the left chest wall and left lower neck. The heart size and mediastinal contours are within normal limits. Low lung volumes. Mild bibasilar atelectasis. No pleural effusion. IMPRESSION: 1. Multiple acute minimally displaced left-sided rib fractures with small left pneumothorax. Electronically Signed   By: Titus Dubin M.D.   On: 04/23/2020 15:28   CT CHEST ABDOMEN PELVIS W CONTRAST  Result Date: 04/23/2020 CLINICAL DATA:  Chest wall pain.  Left-sided pain. EXAM: CT CHEST, ABDOMEN, AND PELVIS WITH CONTRAST TECHNIQUE: Multidetector CT imaging of the chest, abdomen and pelvis was performed following the standard protocol during bolus administration of intravenous contrast. CONTRAST:  16m OMNIPAQUE IOHEXOL 300 MG/ML  SOLN COMPARISON:  None. FINDINGS: CT CHEST FINDINGS Cardiovascular: The heart size is unremarkable. There is no large pericardial effusion. No evidence for thoracic aortic aneurysm or dissection. The arch vessels are patent where visualized. Mediastinum/Nodes: -- No mediastinal lymphadenopathy. -- No hilar lymphadenopathy. -- No axillary lymphadenopathy. -- No supraclavicular lymphadenopathy. -- Normal thyroid gland where visualized. -  Unremarkable esophagus. Lungs/Pleura: There is a moderate-sized left-sided pneumothorax. There is a small left-sided pleural effusion.  There is atelectasis at the left lung base. There are probable pulmonary contusions involving both the left upper and left lower lobe. There is suggestion of a laceration involving the left lower lobe (axial series 6, image 99). There is atelectasis involving the right lung. No right-sided pneumothorax. There is extensive subcutaneous gas along the patient's left flank extending to the left neck. Musculoskeletal: There multiple displaced left-sided rib fractures involving the left fourth rib laterally in addition to the left sixth through tenth ribs. The left 6, eighth, ninth, and tenth ribs are fractured both laterally and posteriorly. There is significant displacement of the see eighth and ninth rib fractures. CT ABDOMEN PELVIS FINDINGS Hepatobiliary: The liver is normal. Normal gallbladder.There is no biliary ductal dilation. Pancreas: Normal contours without ductal dilatation. No peripancreatic fluid collection. Spleen: Unremarkable. Adrenals/Urinary Tract: --Adrenal glands: Unremarkable. --Right kidney/ureter: No hydronephrosis or radiopaque kidney stones. --Left kidney/ureter: No hydronephrosis or  radiopaque kidney stones. --Urinary bladder: Unremarkable. Stomach/Bowel: --Stomach/Duodenum: No hiatal hernia or other gastric abnormality. Normal duodenal course and caliber. --Small bowel: Unremarkable. --Colon: There is scattered colonic diverticula without CT evidence for diverticulitis. --Appendix: Normal. Vascular/Lymphatic: Normal course and caliber of the major abdominal vessels. --No retroperitoneal lymphadenopathy. --No mesenteric lymphadenopathy. --No pelvic or inguinal lymphadenopathy. Reproductive: Unremarkable Other: No ascites or free air. The abdominal wall is normal. Musculoskeletal. No acute displaced fractures. IMPRESSION: 1. Moderate-sized left-sided pneumothorax. Pulmonary contusions and a probable laceration involving the left lower lobe are noted. 2. Small left-sided pleural effusion. 3.  Multiple displaced left-sided rib fractures are noted as detailed above. 4. Scattered colonic diverticula without CT evidence for diverticulitis. Electronically Signed   By: Constance Holster M.D.   On: 04/23/2020 19:00   DG Chest Port 1 View  Result Date: 04/24/2020 CLINICAL DATA:  Closed traumatic fracture of ribs on the left with pneumothorax EXAM: PORTABLE CHEST 1 VIEW COMPARISON:  Yesterday FINDINGS: Left chest tube in stable position. Trace left apical pneumothorax. Similar degree of chest wall emphysema. Atelectasis at the lung bases. Normal heart size and aortic contours for technique. IMPRESSION: 1. Unchanged trace left apical pneumothorax. 2. Atelectasis at the lung bases. Electronically Signed   By: Monte Fantasia M.D.   On: 04/24/2020 04:35   DG CHEST PORT 1 VIEW  Result Date: 04/23/2020 CLINICAL DATA:  Status post trauma. EXAM: PORTABLE CHEST 1 VIEW COMPARISON:  April 23, 2020 (8:29 p.m.) FINDINGS: Since the prior study there is been interval placement of a left-sided chest tube. Its distal tip is seen overlying the medial aspect of the upper left lung, adjacent to the aortic arch. Mild, stable areas of atelectasis and/or early infiltrate are seen within the bilateral lung bases. There is no evidence of a pleural effusion. A small residual left apical pneumothorax is seen. The heart size and mediastinal contours are within normal limits. Multiple left-sided rib fractures are seen. Stable moderate severity subcutaneous emphysema is seen involving the soft tissues of the left neck and along the lateral aspect of the lower left chest wall. IMPRESSION: 1. Interval left-sided chest tube placement positioning, as described above, with a small residual left apical pneumothorax. Electronically Signed   By: Virgina Norfolk M.D.   On: 04/23/2020 23:11   DG Hip Unilat W or Wo Pelvis 2-3 Views Left  Result Date: 04/23/2020 CLINICAL DATA:  Left hip pain after fall. EXAM: DG HIP (WITH OR WITHOUT PELVIS)  2-3V LEFT COMPARISON:  CT abdomen pelvis dated June 05, 2006. FINDINGS: There is no evidence of hip fracture or dislocation. There is no evidence of arthropathy or other focal bone abnormality. IMPRESSION: Negative. Electronically Signed   By: Titus Dubin M.D.   On: 04/23/2020 15:29    Anti-infectives: Anti-infectives (From admission, onward)   Start     Dose/Rate Route Frequency Ordered Stop   04/24/20 1000  valACYclovir (VALTREX) tablet 1,000 mg        1,000 mg Oral Daily 04/23/20 2323         Assessment/Plan Left rib fractures 4, 6-10 with left 6, 8-10 broken in two places with displacement of 8,9 - multimodal pain control, IS/Pulm toilet.  Left pneumothorax secondary to above - s/p pigtail CT 1/5, On - 20 cm. Repeat CXR this AM. If stable, will place to [facility]. CXR in AM.  Left pulmonary contusion and probable left lower lobe lung laceration - As above. Hyperglycemia- presumably stress related, monitor. CBG today. BMP in AM IBS FEN: Reg  diet ID: none VTE: SCD's, Lovenox Foley: none Dispo: med-surg, CXR, pain control, PT    LOS: 2 days    Jillyn Ledger , [facility] 04/25/2020, 9:17 AM Please see Amion for pager number during day hours 7:00am-4:30pm

## 2020-04-25 NOTE — Evaluation (Signed)
Occupational Therapy Evaluation Patient Details Name: Christopher Bell MRN: [id number] DOB: [birth year] Today's Date: 04/25/2020    History of Present Illness Pt is a 60 y/o male admitted after falling while jumping over a fence. Found to have L 4, 6-10 rib fractures, with displacement of 8 and 9. Also with L pneumothorax and is s/p chest tube. PMH includes pancreatitis.   Clinical Impression   Pt admitted with the above diagnoses and presents with below problem list. Pt will benefit from continued acute OT to address the below listed deficits and maximize independence with basic ADLs prior to d/c home. PTA pt was independent with ADLs. Pt currently up to min guard assist with LB ADLs and functional transfers. Pain 9/10 L flank, premedicated.     Follow Up Recommendations  Supervision - Intermittent    Equipment Recommendations  None recommended by OT    Recommendations for Other Services       Precautions / Restrictions Precautions Precautions: Fall;Other (comment) Precaution Comments: chest tube Restrictions Weight Bearing Restrictions: No      Mobility Bed Mobility Overal bed mobility: Needs Assistance Bed Mobility: Supine to Sit;Sit to Supine     Supine to sit: HOB elevated;Min guard Sit to supine: Min guard;HOB elevated   General bed mobility comments: Extra time and effort 2/2 pain. Min guard for safety    Transfers Overall transfer level: Needs assistance Equipment used: None Transfers: Sit to/from Stand Sit to Stand: Min guard         General transfer comment: min guard for safety. to/from EOB at regular height. Guarded movements due to pain.    Balance Overall balance assessment: Needs assistance Sitting-balance support: No upper extremity supported Sitting balance-Leahy Scale: Good     Standing balance support: No upper extremity supported Standing balance-Leahy Scale: Fair Standing balance comment: stands with wide base of support, guarded d/t  pain                           ADL either performed or assessed with clinical judgement   ADL Overall ADL's : Needs assistance/impaired Eating/Feeding: Independent   Grooming: Set up;Sitting   Upper Body Bathing: Set up;Sitting   Lower Body Bathing: Min guard;Sit to/from stand   Upper Body Dressing : Set up;Sitting   Lower Body Dressing: Min guard;Sit to/from stand   Toilet Transfer: Supervision/safety;Ambulation   Toileting- Clothing Manipulation and Hygiene: Min guard;Sit to/from stand   Tub/ Shower Transfer: Walk-in shower;Min guard;Ambulation;Supervision/safety   Functional mobility during ADLs: Supervision/safety;Min guard General ADL Comments: Educated on compensatory strategies for LB ADLs, shower setup.     Vision         Perception     Praxis      Pertinent Vitals/Pain Pain Assessment: 0-10 Pain Score: 9  Pain Location: L sided chest and ribs Pain Descriptors / Indicators: Constant;Grimacing;Guarding Pain Intervention(s): Limited activity within patient's tolerance;Monitored during session;Premedicated before session;Repositioned     Hand Dominance     Extremity/Trunk Assessment Upper Extremity Assessment Upper Extremity Assessment: Overall WFL for tasks assessed   Lower Extremity Assessment Lower Extremity Assessment: Defer to PT evaluation   Cervical / Trunk Assessment Cervical / Trunk Assessment: Other exceptions Cervical / Trunk Exceptions: s/p chest tube. L sided rib fxs   Communication Communication Communication: No difficulties   Cognition Arousal/Alertness: Awake/alert Behavior During Therapy: WFL for tasks assessed/performed Overall Cognitive Status: Within Functional Limits for tasks assessed  General Comments       Exercises     Shoulder Instructions      Home Living Family/patient expects to be discharged to:: Private residence Living Arrangements:  Spouse/significant other Available Help at Discharge: Family;Available 24 hours/day Type of Home: House Home Access: Stairs to enter [employer] of Steps: 3 Entrance Stairs-Rails: Right;Left Home Layout: Two level Alternate Level Stairs-Number of Steps: flight Alternate Level Stairs-Rails: Right Bathroom Shower/Tub: Occupational psychologist: Standard     Home Equipment: Shower seat          Prior Functioning/Environment Level of Independence: Independent                 OT Problem List: Impaired balance (sitting and/or standing);Decreased knowledge of precautions;Decreased knowledge of use of DME or AE;Pain      OT Treatment/Interventions: Self-care/ADL training;DME and/or AE instruction;Therapeutic activities;Balance training;Patient/family education    OT Goals(Current goals can be found in the care plan section) Acute Rehab OT Goals Patient Stated Goal: decrease pain, return home OT Goal Formulation: With patient Time For Goal Achievement: 05/09/20 Potential to Achieve Goals: Good ADL Goals Pt Will Perform Lower Body Bathing: with modified independence;sit to/from stand Pt Will Perform Lower Body Dressing: with modified independence;sit to/from stand Pt Will Transfer to Toilet: with modified independence;ambulating Pt Will Perform Toileting - Clothing Manipulation and hygiene: with modified independence;sit to/from stand Pt Will Perform Tub/Shower Transfer: Shower transfer;with modified independence;ambulating  OT Frequency: Min 2X/week   Barriers to D/C:            Co-evaluation              AM-PAC OT "6 Clicks" Daily Activity     Outcome Measure Help from another person eating meals?: None Help from another person taking care of personal grooming?: None Help from another person toileting, which includes using toliet, bedpan, or urinal?: A Little Help from another person bathing (including washing, rinsing, drying)?: A  Little Help from another person to put on and taking off regular upper body clothing?: None Help from another person to put on and taking off regular lower body clothing?: A Little 6 Click Score: 21   End of Session Nurse Communication: Mobility status  Activity Tolerance: Patient tolerated treatment well;Patient limited by pain Patient left: in bed;with call bell/phone within reach  OT Visit Diagnosis: Unsteadiness on feet (R26.81);Pain                Time: 2992-4268 OT Time Calculation (min): 15 min Charges:  OT General Charges $OT Visit: 1 Visit OT Evaluation $OT Eval Low Complexity: [place], OT Acute Rehabilitation Services Pager: [phone] Office: [phone]   Hortencia Pilar 04/25/2020, 1:38 PM

## 2020-04-25 NOTE — Discharge Instructions (Signed)
PNEUMOTHORAX OR HEMOTHORAX +/- RIB FRACTURES  HOME INSTRUCTIONS   1. PAIN CONTROL:  1. Pain is best controlled by a usual combination of three different methods TOGETHER:  i. Ice/Heat ii. Over the counter pain medication iii. Prescription pain medication 2. You may experience some swelling and bruising in area of broken ribs. Ice packs or heating pads (30-60 minutes up to 6 times a day) will help. Use ice for the first few days to help decrease swelling and bruising, then switch to heat to help relax tight/sore spots and speed recovery. Some people prefer to use ice alone, heat alone, alternating between ice & heat. Experiment to what works for you. Swelling and bruising can take several weeks to resolve.  3. It is helpful to take an over-the-counter pain medication regularly for the first few weeks. Choose one of the following that works best for you:  i. Naproxen (Aleve, etc) Two 220mg tabs twice a day ii. Ibuprofen (Advil, etc) Three 200mg tabs four times a day (every meal & bedtime) iii. Acetaminophen (Tylenol, etc) 500-650mg four times a day (every meal & bedtime) 4. A prescription for pain medication (such as oxycodone, hydrocodone, etc) may be given to you upon discharge. Take your pain medication as prescribed.  i. If you are having problems/concerns with the prescription medicine (does not control pain, nausea, vomiting, rash, itching, etc), please call us [phone] to see if we need to switch you to a different pain medicine that will work better for you and/or control your side effect better. ii. If you need a refill on your pain medication, please contact your pharmacy. They will contact our office to request authorization. Prescriptions will not be filled after 5 pm or on week-ends. 1. Avoid getting constipated. When taking pain medications, it is common to experience some constipation. Increasing fluid intake and taking a fiber supplement (such as Metamucil, Citrucel, FiberCon,  MiraLax, etc) 1-2 times a day regularly will usually help prevent this problem from occurring. A mild laxative (prune juice, Milk of Magnesia, MiraLax, etc) should be taken according to package directions if there are no bowel movements after 48 hours.  2. Watch out for diarrhea. If you have many loose bowel movements, simplify your diet to bland foods & liquids for a few days. Stop any stool softeners and decrease your fiber supplement. Switching to mild anti-diarrheal medications (Kayopectate, Pepto Bismol) can help. If this worsens or does not improve, please call us. 3. Chest tube site wound: you may remove the dressing from your chest tube site 3 days after the removal of your chest tube. DO NOT shower over the dressing. Once   removed, you may shower as normal. Do not submerge your wound in water for 2-3 weeks.  4. FOLLOW UP  a. Please call our office to set up or confirm an appointment for follow up for 2 weeks after discharge. You will need to get a chest xray at either [facility] or [facility]. This will be outlined in your follow up instructions. Please call [facility] at [phone] if you have any questions about follow up.  b. If you have any orthopedic or other injuries you will need to follow up as outlined in your follow up instructions.   WHEN TO CALL US [phone]:  1. Poor pain control 2. Reactions / problems with new medications (rash/itching, nausea, etc)  3. Fever over 101.5 F (38.5 C) 4. Worsening swelling or bruising 5. Redness, drainage, pain or swelling around chest   tube site 6. Worsening pain, productive cough, difficulty breathing or any other concerning symptoms  The clinic staff is available to answer your questions during regular business hours (8:30am-5pm). Please don't hesitate to call and ask to speak to one of our nurses for clinical concerns.  If you have a medical emergency, go to the nearest emergency room or call 911.  A surgeon from [facility] is always on call at the hospitals   [facility]  [address], [address], [place], [place] [address] ?  MAIN: [phone] ? TOLL FREE: [phone] ?  FAX [phone]  [email or web address]      Information on Rib Fractures  A rib fracture is a break or crack in one of the bones of the ribs. The ribs are long, curved bones that wrap around your chest and attach to your spine and your breastbone. The ribs protect your heart, lungs, and other organs in the chest. A broken or cracked rib is often painful but is not usually serious. Most rib fractures heal on their own over time. However, rib fractures can be more serious if multiple ribs are broken or if broken ribs move out of place and push against other structures or organs. What are the causes? This condition is caused by:  Repetitive movements with high force, such as pitching a baseball or having severe coughing spells.  A direct blow to the chest, such as a sports injury, a car accident, or a fall.  Cancer that has spread to the bones, which can weaken bones and cause them to break. What are the signs or symptoms? Symptoms of this condition include:  Pain when you breathe in or cough.  Pain when someone presses on the injured area.  Feeling short of breath. How is this diagnosed? This condition is diagnosed with a physical exam and medical history. Imaging tests may also be done, such as:  Chest X-ray.  CT scan.  MRI.  Bone scan.  Chest ultrasound. How is this treated? Treatment for this condition depends on the severity of the fracture. Most rib fractures usually heal on their own in 1-3 months. Sometimes healing takes longer if there is a cough that does not stop or if there are other activities that make the injury worse (aggravating factors). While you heal, you will be given medicines to control the pain. You will also be taught deep breathing  exercises. Severe injuries may require hospitalization or surgery. Follow these instructions at home: Managing pain, stiffness, and swelling  If directed, apply ice to the injured area. ? Put ice in a plastic bag. ? Place a towel between your skin and the bag. ? Leave the ice on for 20 minutes, 2-3 times a day.  Take over-the-counter and prescription medicines only as told by your health care provider. Activity  Avoid a lot of activity and any activities or movements that cause pain. Be careful during activities and avoid bumping the injured rib.  Slowly increase your activity as told by your health care provider. General instructions  Do deep breathing exercises as told by your health care provider. This helps prevent pneumonia, which is a common complication of a broken rib. Your health care provider may instruct you to: ? Take deep breaths several times a day. ? Try to cough several times a day, holding a pillow against the injured area. ? Use a device called incentive spirometer to practice deep breathing several times a day.  Drink enough   fluid to keep your urine pale yellow.  Do not wear a rib belt or binder. These restrict breathing, which can lead to pneumonia.  Keep all follow-up visits as told by your health care provider. This is important. Contact a health care provider if:  You have a fever. Get help right away if:  You have difficulty breathing or you are short of breath.  You develop a cough that does not stop, or you cough up thick or bloody sputum.  You have nausea, vomiting, or pain in your abdomen.  Your pain gets worse and medicine does not help. Summary  A rib fracture is a break or crack in one of the bones of the ribs.  A broken or cracked rib is often painful but is not usually serious.  Most rib fractures heal on their own over time.  Treatment for this condition depends on the severity of the fracture.  Avoid a lot of activity and any  activities or movements that cause pain. This information is not intended to replace advice given to you by your health care provider. Make sure you discuss any questions you have with your health care provider. Document Released: 04/05/2005 Document Revised: 07/05/2016 Document Reviewed: 07/05/2016 Elsevier Interactive Patient Education  2019 Elsevier Inc[employer]    Pneumothorax A pneumothorax is commonly called a collapsed lung. It is a condition in which air leaks from a lung and builds up between the thin layer of tissue that covers the lungs (visceral pleura) and the interior wall of the chest cavity (parietal pleura). The air gets trapped outside the lung, between the lung and the chest wall (pleural space). The air takes up space and prevents the lung from fully expanding. This condition sometimes occurs suddenly with no apparent cause. The buildup of air may be small or large. A small pneumothorax may go away on its own. A large pneumothorax will require treatment and hospitalization. What are the causes? This condition may be caused by:  Trauma and injury to the chest wall.  Surgery and other medical procedures.  A complication of an underlying lung problem, especially chronic obstructive pulmonary disease (COPD) or emphysema. Sometimes the cause of this condition is not known. What increases the risk? You are more likely to develop this condition if:  You have an underlying lung problem.  You smoke.  You are 20-40 years old, male, tall, and underweight.  You have a personal or family history of pneumothorax.  You have an eating disorder (anorexia nervosa). This condition can also happen quickly, even in people with no history of lung problems. What are the signs or symptoms? Sometimes a pneumothorax will have no symptoms. When symptoms are present, they can include:  Chest pain.  Shortness of breath.  Increased rate of breathing.  Bluish color to your lips or skin  (cyanosis). How is this diagnosed? This condition may be diagnosed by:  A medical history and physical exam.  A chest X-ray, chest CT scan, or ultrasound. How is this treated? Treatment depends on how severe your condition is. The goal of treatment is to remove the extra air and allow your lung to expand back to its normal size.  For a small pneumothorax: ? No treatment may be needed. ? Extra oxygen is sometimes used to make it go away more quickly.  For a large pneumothorax or a pneumothorax that is causing symptoms, a procedure is done to drain the air from your lungs. To do this, a health care provider may   use: ? A needle with a syringe. This is used to suck air from a pleural space where no additional leakage is taking place. ? A chest tube. This is used to suck air where there is ongoing leakage into the pleural space. The chest tube may need to remain in place for several days until the air leak has healed.  In more severe cases, surgery may be needed to repair the damage that is causing the leak.  If you have multiple pneumothorax episodes or have an air leak that will not heal, a procedure called a pleurodesis may be done. A medicine is placed in the pleural space to irritate the tissues around the lung so that the lung will stick to the chest wall, seal any leaks, and stop any buildup of air in that space. If you have an underlying lung problem, severe symptoms, or a large pneumothorax you will usually need to stay in the hospital. Follow these instructions at home: Lifestyle  Do not use any products that contain nicotine or tobacco, such as cigarettes and e-cigarettes. These are major risk factors in pneumothorax. If you need help quitting, ask your health care provider.  Do not lift anything that is heavier than 10 lb (4.5 kg), or the limit that your health care provider tells you, until he or she says that it is safe.  Avoid activities that take a lot of effort (strenuous)  for as long as told by your health care provider.  Return to your normal activities as told by your health care provider. Ask your health care provider what activities are safe for you.  Do not fly in an airplane or scuba dive until your health care provider says it is okay. General instructions  Take over-the-counter and prescription medicines only as told by your health care provider.  If a cough or pain makes it difficult for you to sleep at night, try sleeping in a semi-upright position in a recliner or by using 2 or 3 pillows.  If you had a chest tube and it was removed, ask your health care provider when you can remove the bandage (dressing). While the dressing is in place, do not allow it to get wet.  Keep all follow-up visits as told by your health care provider. This is important. Contact a health care provider if:  You cough up thick mucus (sputum) that is yellow or green in color.  You were treated with a chest tube, and you have redness, increasing pain, or discharge at the site where it was placed. Get help right away if:  You have increasing chest pain or shortness of breath.  You have a cough that will not go away.  You begin coughing up blood.  You have pain that is getting worse or is not controlled with medicines.  The site where your chest tube was located opens up.  You feel air coming out of the site where the chest tube was placed.  You have a fever or persistent symptoms for more than 2-3 days.  You have a fever and your symptoms suddenly get worse. These symptoms may represent a serious problem that is an emergency. Do not wait to see if the symptoms will go away. Get medical help right away. Call your local emergency services (911 in the [place] Do not drive yourself to the hospital. Summary  A pneumothorax, commonly called a collapsed lung, is a condition in which air leaks from a lung and gets trapped between the   lung and the chest wall (pleural  space).  The buildup of air may be small or large. A small pneumothorax may go away on its own. A large pneumothorax will require treatment and hospitalization.  Treatment for this condition depends on how severe the pneumothorax is. The goal of treatment is to remove the extra air and allow the lung to expand back to its normal size. This information is not intended to replace advice given to you by your health care provider. Make sure you discuss any questions you have with your health care provider. Document Released: 04/05/2005 Document Revised: 03/14/2017 Document Reviewed: 03/14/2017 Elsevier Interactive Patient Education  2019 Elsevier Inc.   

## 2020-04-25 NOTE — TOC CAGE-AID Note (Signed)
Transition of Care [facility]) - CAGE-AID Screening   Patient Details  Name: Christopher Bell MRN: [id number] Date of Birth: 09/07/1960   Clinical Narrative: Pt from home with wife. Suffered left sided rib fractures, PTX, and pulmonary contusion s/p a fall over a fence that he was attempting to jump over. Mr. Michalec does report history of alcohol abuse, but has been sober for 11 years and has a strong support system. Resources offered and reviewed. Denies any drug abuse. Screening complete.   CAGE-AID Screening:    Have You Ever Felt You Ought to Cut Down on Your Drinking or Drug Use?: Yes Have People Annoyed You By Critizing Your Drinking Or Drug Use?: Yes Have You Felt Bad Or Guilty About Your Drinking Or Drug Use?: Yes Have You Ever Had a Drink or Used Drugs First Thing In The Morning to Steady Your Nerves or to Get Rid of a Hangover?: No CAGE-AID Score: 3

## 2020-04-25 NOTE — Progress Notes (Signed)
Complained of severe pain on left chest. Alternated pain med with Fentanyl, Oxycodone,Tylenol, Ibuprofen and Robaxin. Pt slept for a while after Fentanyl given and then woke up with pain.   Auscultated lungs sound clear, had no wheezing or stridor.   Serosanguinous drainage appeared total 100 ml in cannister with -20 cmH2o of suction. Chest tube dressing changed. Surrounding skin intact, no bleeding, no subcutaneous emphysema. Chest tube had no air leak.  He's hemodynamically stable. Remained afebrile. On room air when he slept, his SPO2 88%. O2 NCL 2 lpm given. We continue to monitor.  Kennyth Lose, RN

## 2020-04-25 NOTE — Progress Notes (Signed)
Physical Therapy Treatment Patient Details Name: Christopher Bell MRN: [id number] DOB: [birth year] Today's Date: 04/25/2020    History of Present Illness Pt is a 60 y/o male admitted after falling while jumping over a fence. Found to have L 4, 6-10 rib fractures, with displacement of 8 and 9. Also with L pneumothorax and is s/p chest tube. PMH includes pancreatitis.    PT Comments    Pt with excellent progress. Able to amb hallways with supervision/min guard. SpO2 96% on RA after amb.    Follow Up Recommendations  No PT follow up     Equipment Recommendations  None recommended by PT    Recommendations for Other Services       Precautions / Restrictions Precautions Precautions: Fall;Other (comment) Precaution Comments: chest tube Restrictions Weight Bearing Restrictions: No    Mobility  Bed Mobility Overal bed mobility: Needs Assistance Bed Mobility: Supine to Sit;Sit to Supine     Supine to sit: Supervision;HOB elevated Sit to supine: HOB elevated;Supervision   General bed mobility comments: Pt with incr time to perform and HOB elevated but no physical assist. Assist for lines  Transfers Overall transfer level: Needs assistance Equipment used: None Transfers: Sit to/from Stand Sit to Stand: Supervision         General transfer comment: supervision for lines/safety  Ambulation/Gait Ambulation/Gait assistance: Supervision;Min guard Gait Distance (Feet): 425 Feet Assistive device: None;4-wheeled walker Gait Pattern/deviations: Step-through pattern;Decreased stride length Gait velocity: decr Gait velocity interpretation: <1.31 ft/sec, indicative of household ambulator General Gait Details: Slow guarded gait. Used rollator for chest tube and to incr comfort   Stairs             Wheelchair Mobility    Modified Rankin (Stroke Patients Only)       Balance Overall balance assessment: Needs assistance Sitting-balance support: No upper extremity  supported Sitting balance-Leahy Scale: Good     Standing balance support: No upper extremity supported Standing balance-Leahy Scale: Fair Standing balance comment: stands with wide base of support, guarded d/t pain                            Cognition Arousal/Alertness: Awake/alert Behavior During Therapy: WFL for tasks assessed/performed Overall Cognitive Status: Within Functional Limits for tasks assessed                                        Exercises      General Comments General comments (skin integrity, edema, etc.): VSS on RA. SpO2 96% after amb      Pertinent Vitals/Pain Pain Assessment: 0-10 Pain Score: 8  Pain Location: L sided chest and ribs Pain Descriptors / Indicators: Grimacing;Guarding Pain Intervention(s): Limited activity within patient's tolerance;Monitored during session;Repositioned;Patient requesting pain meds-RN notified    Home Living Family/patient expects to be discharged to:: Private residence Living Arrangements: Spouse/significant other Available Help at Discharge: Family;Available 24 hours/day Type of Home: House Home Access: Stairs to enter Entrance Stairs-Rails: Right;Left Home Layout: Two level Home Equipment: Shower seat      Prior Function Level of Independence: Independent          PT Goals (current goals can now be found in the care plan section) Acute Rehab PT Goals Patient Stated Goal: to decrease pain Progress towards PT goals: Progressing toward goals    Frequency    Min 3X/week  PT Plan Current plan remains appropriate    Co-evaluation              AM-PAC PT "6 Clicks" Mobility   Outcome Measure  Help needed turning from your back to your side while in a flat bed without using bedrails?: A Little Help needed moving from lying on your back to sitting on the side of a flat bed without using bedrails?: A Little Help needed moving to and from a bed to a chair (including a  wheelchair)?: A Little Help needed standing up from a chair using your arms (e.g., wheelchair or bedside chair)?: None Help needed to walk in hospital room?: A Little Help needed climbing 3-5 steps with a railing? : A Little 6 Click Score: 19    End of Session   Activity Tolerance: Patient tolerated treatment well Patient left: in bed;with call bell/phone within reach;with family/visitor present Nurse Communication: Mobility status PT Visit Diagnosis: Difficulty in walking, not elsewhere classified (R26.2);Pain Pain - Right/Left: Left Pain - part of body:  (chest and ribs)     Time: 2924-4628 PT Time Calculation (min) (ACUTE ONLY): 22 min  Charges:  $Gait Training: 8-22 mins                     [place] Pager [phone] Office [place] 04/25/2020, 4:27 PM

## 2020-04-26 ENCOUNTER — Other Ambulatory Visit: Payer: Self-pay

## 2020-04-26 ENCOUNTER — Inpatient Hospital Stay (HOSPITAL_COMMUNITY): Payer: BC Managed Care – PPO

## 2020-04-26 ENCOUNTER — Encounter (HOSPITAL_COMMUNITY): Payer: Self-pay

## 2020-04-26 LAB — CBC
HCT: 35.5 % — ABNORMAL LOW (ref 39.0–52.0)
Hemoglobin: 12.5 g/dL — ABNORMAL LOW (ref 13.0–17.0)
MCH: 31.2 pg (ref 26.0–34.0)
MCHC: 35.2 g/dL (ref 30.0–36.0)
MCV: 88.5 fL (ref 80.0–100.0)
Platelets: 229 10*3/uL (ref 150–400)
RBC: 4.01 MIL/uL — ABNORMAL LOW (ref 4.22–5.81)
RDW: 11.9 % (ref 11.5–15.5)
WBC: 8.7 10*3/uL (ref 4.0–10.5)
nRBC: 0 % (ref 0.0–0.2)

## 2020-04-26 LAB — BASIC METABOLIC PANEL
Anion gap: 9 (ref 5–15)
BUN: 6 mg/dL (ref 6–20)
CO2: 29 mmol/L (ref 22–32)
Calcium: 8.7 mg/dL — ABNORMAL LOW (ref 8.9–10.3)
Chloride: 101 mmol/L (ref 98–111)
Creatinine, Ser: 1.02 mg/dL (ref 0.61–1.24)
GFR, Estimated: >60 mL/min (ref >60)
Glucose, Bld: 114 mg/dL — ABNORMAL HIGH (ref 70–99)
Potassium: 3.3 mmol/L — ABNORMAL LOW (ref 3.5–5.1)
Sodium: 139 mmol/L (ref 135–145)

## 2020-04-26 NOTE — Progress Notes (Signed)
Subjective/Chief Complaint: CXR looks good today Pt tol pain well IS   Objective: Vital signs in last 24 hours: Temp:  [97.9 F (36.6 C)-98.3 F (36.8 C)] 98.3 F (36.8 C) (01/08 0700) Pulse Rate:  [79-86] 80 (01/08 0700) Resp:  [16-18] 16 (01/08 0700) BP: (119-134)/(75-89) 134/79 (01/08 0700) SpO2:  [93 %-97 %] 93 % (01/08 0700) Last BM Date: 04/23/20 (stated)  Intake/Output from previous day: 01/07 0701 - 01/08 0700 In: 250 [P.O.:250] Out: 1310 [Urine:1200; Chest Tube:110] Intake/Output this shift: Total I/O In: -  Out: 200 [Urine:200]  PE:  Constitutional: No acute distress, conversant, appears states age. Eyes: Anicteric sclerae, moist conjunctiva, no lid lag Lungs: Clear to auscultation bilaterally, normal respiratory effort CV: regular rate and rhythm, no murmurs, no peripheral edema, pedal pulses 2+, CT on water seal GI: Soft, no masses or hepatosplenomegaly, non-tender to palpation Skin: No rashes, palpation reveals normal turgor Psychiatric: appropriate judgment and insight, oriented to person, place, and time   Lab Results:  Recent Labs    04/25/20 0628 04/26/20 0122  WBC 9.5 8.7  HGB 12.8* 12.5*  HCT 38.7* 35.5*  PLT 229 229   BMET Recent Labs    04/23/20 1528 04/23/20 1630 04/23/20 2331 04/26/20 0122  NA 139 142  --  139  K 4.1 4.0  --  3.3*  CL 105 103  --  101  CO2 24  --   --  29  GLUCOSE 153* 149*  --  114*  BUN 10 11  --  6  CREATININE 0.95 0.80 0.98 1.02  CALCIUM 9.1  --   --  8.7*   PT/INR Recent Labs    04/23/20 1528  LABPROT 13.0  INR 1.0   ABG No results for input(s): PHART, HCO3 in the last 72 hours.  Invalid input(s): PCO2, PO2  Studies/Results: DG CHEST PORT 1 VIEW  Result Date: 04/26/2020 CLINICAL DATA:  60 year old male with left pneumothorax. EXAM: PORTABLE CHEST 1 VIEW COMPARISON:  04/25/2020 FINDINGS: The heart size and mediastinal contours are within normal limits. Unchanged position of the left  indwelling pigtail thoracostomy tube with the pigtail portion in the left upper lobe. No evidence of significant residual left pneumothorax. Persistent, similar appearing trace left pleural effusion. Similar appearing mild bibasilar subsegmental atelectasis. Similar appearance of subcutaneous emphysema along the left supraclavicular region and left inferior thoracic mid axillary line. Unchanged appearance of multilevel left partially displaced acute rib fractures. IMPRESSION: Unchanged position of indwelling left thoracostomy tube without evidence of significant residual pneumothorax. Unchanged trace left pleural effusion and bibasilar subsegmental atelectasis. Unchanged associated left chest wall subcutaneous emphysema. Electronically Signed   By: Ruthann Cancer MD   On: 04/26/2020 08:32   DG CHEST PORT 1 VIEW  Result Date: 04/25/2020 CLINICAL DATA:  Left chest pain with inspiration. Cough. Pneumothorax. Chest tube in place. EXAM: PORTABLE CHEST 1 VIEW COMPARISON:  April 24, 2020 FINDINGS: Similar position of a left chest tube. Similar trace left apical pneumothorax. Slightly increased left chest/neck subcutaneous emphysema. Similar left greater than right basilar opacities. Stable cardiomediastinal silhouette, within normal limits. Redemonstrated displaced left rib fractures. IMPRESSION: 1. Similar trace left apical pneumothorax with left chest tube in place. 2. Similar left greater than right basilar opacities, which could represent atelectasis, aspiration, and/or pneumonia. Electronically Signed   By: Margaretha Sheffield MD   On: 04/25/2020 10:01    Anti-infectives: Anti-infectives (From admission, onward)   Start     Dose/Rate Route Frequency Ordered Stop   04/24/20  1000  valACYclovir (VALTREX) tablet 1,000 mg        1,000 mg Oral Daily 04/23/20 2323        Assessment/Plan: Left rib fractures 4, 6-10 with left 6, 8-10 broken in two places with displacement of 8,9- multimodal pain control, IS/Pulm  toilet. Left pneumothorax secondary to above- s/p pigtail CT 1/5- will DC today and f/u CXR. Left pulmonary contusion and probable left lower lobe lung laceration - As above. Hyperglycemia-presumably stress related, monitor. CBG today. BMP in AM IBS FEN: Reg diet ID: none VTE: SCD's, Lovenox Foley: none Dispo: med-surg, CXR, pain control, PT    LOS: 3 days    Ralene Ok 04/26/2020

## 2020-04-26 NOTE — Progress Notes (Signed)
Chest tube removed without difficulty will plan repeat chest x-ray around noon.  Patient reports he would like to go home if his chest x-ray is okay later today.

## 2020-04-26 NOTE — Progress Notes (Signed)
Pt call and ask "not to give him any pain medication until his next VS".

## 2020-04-26 NOTE — Progress Notes (Signed)
Physical Therapy Treatment Patient Details Name: Christopher Bell MRN: [id number] DOB: [birth year] Today's Date: 04/26/2020    History of Present Illness Pt is a 60 y/o male admitted after falling while jumping over a fence. Found to have L 4, 6-10 rib fractures, with displacement of 8 and 9. Also with L pneumothorax and is s/p chest tube. PMH includes pancreatitis. Chest tube removed 1/8.    PT Comments    Pt progressing well with mobility. Chest tube removed this AM. He demonstrates modified independence with bed mobility. Supervision for transfers, ambulation 250' without AD, and ascend/descend 1 flight of stairs with bilat rails. Pt on RA. Desat to 90% with max HR 109 during mobility. SpO2 94% seated at end of session.     Follow Up Recommendations  No PT follow up     Equipment Recommendations  None recommended by PT    Recommendations for Other Services       Precautions / Restrictions Precautions Precautions: Fall Restrictions Other Position/Activity Restrictions: Reviewed/educated on use of pillow/splinting L side when coughing.    Mobility  Bed Mobility Overal bed mobility: Modified Independent             General bed mobility comments: HOB elevated, +rail, increased time  Transfers Overall transfer level: Needs assistance Equipment used: None Transfers: Sit to/from Stand Sit to Stand: Supervision         General transfer comment: supervision for safety  Ambulation/Gait Ambulation/Gait assistance: Supervision Gait Distance (Feet): 250 Feet Assistive device: None Gait Pattern/deviations: Step-through pattern Gait velocity: decreased Gait velocity interpretation: <1.31 ft/sec, indicative of household ambulator General Gait Details: slow guarded gait. No LOB. No physical assist needed.   Stairs Stairs: Yes Stairs assistance: Supervision Stair Management: Two rails;Forwards;Alternating pattern Number of Stairs: 12     Wheelchair Mobility     Modified Rankin (Stroke Patients Only)       Balance Overall balance assessment: Needs assistance Sitting-balance support: No upper extremity supported;Feet supported Sitting balance-Leahy Scale: Good     Standing balance support: No upper extremity supported;During functional activity Standing balance-Leahy Scale: Fair Standing balance comment: guarded due to pain                            Cognition Arousal/Alertness: Awake/alert Behavior During Therapy: WFL for tasks assessed/performed Overall Cognitive Status: Within Functional Limits for tasks assessed                                        Exercises      General Comments General comments (skin integrity, edema, etc.): VSS on RA. SpO2 90% during stair training. Max HR 109. SpO2 94% at end of session.      Pertinent Vitals/Pain Pain Assessment: 0-10 Pain Score: 7  Pain Location: L sided chest and ribs during mobility Pain Descriptors / Indicators: Grimacing;Guarding;Tightness Pain Intervention(s): Monitored during session;Repositioned    Home Living   Living Arrangements: Spouse/significant other                  Prior Function            PT Goals (current goals can now be found in the care plan section) Acute Rehab PT Goals Patient Stated Goal: home Progress towards PT goals: Progressing toward goals    Frequency    Min 3X/week      PT  Plan Current plan remains appropriate    Co-evaluation              AM-PAC PT "6 Clicks" Mobility   Outcome Measure  Help needed turning from your back to your side while in a flat bed without using bedrails?: None Help needed moving from lying on your back to sitting on the side of a flat bed without using bedrails?: None Help needed moving to and from a bed to a chair (including a wheelchair)?: A Little Help needed standing up from a chair using your arms (e.g., wheelchair or bedside chair)?: A Little Help needed to  walk in hospital room?: A Little Help needed climbing 3-5 steps with a railing? : A Little 6 Click Score: 20    End of Session Equipment Utilized During Treatment: Gait belt Activity Tolerance: Patient tolerated treatment well Patient left: in bed;with call bell/phone within reach Nurse Communication: Mobility status PT Visit Diagnosis: Difficulty in walking, not elsewhere classified (R26.2);Pain Pain - Right/Left: Left     Time: 5809-9833 PT Time Calculation (min) (ACUTE ONLY): 25 min  Charges:  $Gait Training: 23-37 mins                     Lorrin Goodell, [place]  Office # [phone] Pager [phone]    Lorriane Shire 04/26/2020, 2:39 PM

## 2020-04-27 ENCOUNTER — Inpatient Hospital Stay (HOSPITAL_COMMUNITY): Payer: BC Managed Care – PPO

## 2020-04-27 MED ORDER — METHOCARBAMOL 500 MG PO TABS: 1000.0000 mg | ORAL_TABLET | Freq: Three times a day (TID) | ORAL | 0 refills | Status: DC

## 2020-04-27 MED ORDER — OXYCODONE HCL 10 MG PO TABS: ORAL_TABLET | ORAL | 0 refills | Status: DC

## 2020-04-27 MED ORDER — ACETAMINOPHEN 500 MG PO TABS: 1000.0000 mg | ORAL_TABLET | Freq: Four times a day (QID) | ORAL | 0 refills | Status: DC

## 2020-04-27 MED ORDER — LIDOCAINE 5 % EX PTCH: 1.0000 | MEDICATED_PATCH | CUTANEOUS | 0 refills | Status: DC

## 2020-04-27 MED ORDER — IBUPROFEN 200 MG PO TABS: ORAL_TABLET | ORAL | Status: DC

## 2020-04-27 NOTE — Plan of Care (Signed)
Pt ready for d/c, will continue to monitor.

## 2020-04-27 NOTE — Progress Notes (Signed)
Removed IV, off tele. Pt/family given discharge instructions, medication lists, follow up appointments, and when to call the doctor.  Pt/family verbalizes understanding. Medications sent to [facility] which is closed today.  Paged MD to make aware that pt would like medications sent to [facility] on [facility]. Payton Emerald, RN

## 2020-04-27 NOTE — Discharge Summary (Signed)
Physician Discharge Summary  Patient ID: Christopher Bell MRN: [id number] DOB/AGE: April 22, 1960 60 y.o.  Admit date: 04/23/2020 Discharge date: 04/27/2020  Admission Diagnoses:  Fall Left rib fractures 4, 6-10 with left 6, 8-10 broken in two places with displacement of 8,9 Left pneumothorax secondary to above Left pulmonary contusion and probable left lower lobe lung laceration Hyperglycemia- presumably stress related IBS   Discharge Diagnoses:  Same  Active Problems:   Closed traumatic fracture of ribs of left side with pneumothorax   PROCEDURES: Left chest tube placement  Hospital Course:  Pt is a 60 yo M who presented to the ED today after a fall.  He tried jumping over a fence to get to the back door of his mother's house since the front storm door was locked.  He fell due to the muddy ground and struck his left side on the fence.  He had immediate pain and shortness of breath.  He was seen to have left rib fractures and subcutaneous air.  His initial chest xray showed a small PTX, but it appeared more moderate on CT, and a follow up chest xray showed a larger left PTX. His shortness of breath is mild, but he is having lots of left chest pain.  He denies n/v.  He denies striking anything other than his left side.  This was a witnessed fall.    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  He knows Rodney Langton and [employer].    Of note, he has had 3 COVID vaccines and has no upper respiratory symptoms or known sick contacts.  Pt was admitted after chest tube placement.  Pain was controlled with combination of Tylenol, IV p.o. narcotics, Robaxin.  CT was removed on 04/26/20, he had a small residual Ptx that is stable and was discharged by Dr. Rosendo Gros today 04/27/20.  Condition on DC:  Improved  CBC Latest Ref Rng & Units 04/26/2020 04/25/2020 04/24/2020  WBC 4.0 - 10.5 K/uL  8.7 9.5 11.2(H)  Hemoglobin 13.0 - 17.0 g/dL 12.5(L) 12.8(L) 13.4  Hematocrit 39.0 - 52.0 % 35.5(L) 38.7(L) 40.4  Platelets 150 - 400 K/uL 229 229 268   CMP Latest Ref Rng & Units 04/26/2020 04/23/2020 04/23/2020  Glucose 70 - 99 mg/dL 114(H) - 149(H)  BUN 6 - 20 mg/dL 6 - 11  Creatinine 0.61 - 1.24 mg/dL 1.02 0.98 0.80  Sodium 135 - 145 mmol/L 139 - 142  Potassium 3.5 - 5.1 mmol/L 3.3(L) - 4.0  Chloride 98 - 111 mmol/L 101 - 103  CO2 22 - 32 mmol/L 29 - -  Calcium 8.9 - 10.3 mg/dL 8.7(L) - -  Total Protein 6.5 - 8.1 g/dL - - -  Total Bilirubin 0.3 - 1.2 mg/dL - - -  Alkaline Phos 38 - 126 U/L - - -  AST 15 - 41 U/L - - -  ALT 0 - 44 U/L - - -   CXR 04/27/20:1. Unchanged small left apical pneumothorax and likely trace left pleural fluid with multiple displaced left rib fractures and extensive subcutaneous emphysema extending across the left chest wall and base of the neck. 2. Patchy opacities in both lungs likely reflect a combination of pulmonary contusion and atelectasis.   Disposition: Discharge disposition: 01-Home or Self Care       Discharge Instructions    Diet - low sodium heart healthy  Complete by: As directed    Increase activity slowly   Complete by: As directed      Allergies as of 04/27/2020   No Known Allergies     Medication List    STOP taking these medications   loperamide 2 MG tablet Commonly known as: IMODIUM A-D   omeprazole 20 MG capsule Commonly known as: PRILOSEC   valACYclovir 1000 MG tablet Commonly known as: Valtrex     TAKE these medications   acetaminophen 500 MG tablet Commonly known as: TYLENOL Take 2 tablets (1,000 mg total) by mouth every 6 (six) hours.   ascorbic acid 100 MG tablet Commonly known as: VITAMIN C Take 100 mg by mouth daily.   GLUCOSAMINE HCL PO Take 1 tablet by mouth daily.   ibuprofen 200 MG tablet Commonly known as: ADVIL You can take 2 to 3 tablets every 6 hours as needed for pain not relieved by plain  Tylenol.  You can start this 2 hours after you take your Tylenol dose.  You can buy this over-the-counter at any drugstore.   lidocaine 5 % Commonly known as: LIDODERM Place 1 patch onto the skin daily. Remove & Discard patch within 12 hours or as directed by MD   methocarbamol 500 MG tablet Commonly known as: ROBAXIN Take 2 tablets (1,000 mg total) by mouth 3 (three) times daily for 10 days.   Multi-Vitamin tablet Take 1 tablet by mouth daily.   Omega-3 1000 MG Caps Take 1,000 mg by mouth daily.   Oxycodone HCl 10 MG Tabs You can take one tablet every 6 hours as needed for pain not relieved by Tylenol/acetaminophen, ibuprofen, and Robaxin.       Follow-up Information    [facility]. Go on 05/15/2020.   Why: 1040am. Please arrive 30 minutes prior to your appointment for paperwork. Please bring a copy of your photo ID and insurance card.  Contact information: [place] [phone] [place]. Go on 05/14/2020.   Why: For a chest xray Contact information: [place] [address] [phone]        Roma Schanz R, DO Follow up.   Specialty: Family Medicine Why: call and let Dr. Cheri Rous know about your hospitalization. See her for Medical issues. Contact information: [address] [place] [address] [phone]               Signed: Earnstine Regal 04/27/2020, 9:16 AM

## 2020-04-28 ENCOUNTER — Encounter: Payer: Self-pay | Admitting: Family Medicine

## 2020-04-28 NOTE — Telephone Encounter (Signed)
Was your comment below meant for this Pt?

## 2020-04-28 NOTE — Telephone Encounter (Signed)
Glad to know there is no caffeine in it---  it sounds good to me Let me know how you like it    Dr Kennieth Rad

## 2020-04-29 NOTE — Telephone Encounter (Signed)
That is very strange--- no it was meant for another pt from yesterday

## 2020-05-08 MED ORDER — METHOCARBAMOL 500 MG PO TABS: 1000.0000 mg | ORAL_TABLET | Freq: Three times a day (TID) | ORAL | 3 refills | Status: DC

## 2020-05-13 ENCOUNTER — Ambulatory Visit
Admission: RE | Admit: 2020-05-13 | Discharge: 2020-05-13 | Disposition: A | Discharge status: Home / Self Care | Payer: BC Managed Care – PPO | Source: Ambulatory Visit | Attending: Physician Assistant | Admitting: Physician Assistant

## 2020-05-13 ENCOUNTER — Other Ambulatory Visit: Payer: Self-pay

## 2020-05-13 ENCOUNTER — Other Ambulatory Visit: Payer: Self-pay | Admitting: Physician Assistant

## 2020-05-13 DIAGNOSIS — J939 Pneumothorax, unspecified: Secondary | ICD-10-CM

## 2020-05-13 DIAGNOSIS — S2242XA Multiple fractures of ribs, left side, initial encounter for closed fracture: Secondary | ICD-10-CM | POA: Diagnosis not present

## 2020-05-13 DIAGNOSIS — J90 Pleural effusion, not elsewhere classified: Secondary | ICD-10-CM | POA: Diagnosis not present

## 2020-05-15 DIAGNOSIS — J939 Pneumothorax, unspecified: Secondary | ICD-10-CM | POA: Diagnosis not present

## 2020-05-15 DIAGNOSIS — S2249XD Multiple fractures of ribs, unspecified side, subsequent encounter for fracture with routine healing: Secondary | ICD-10-CM | POA: Diagnosis not present

## 2020-07-16 ENCOUNTER — Other Ambulatory Visit: Payer: Self-pay | Admitting: Family Medicine

## 2020-07-16 ENCOUNTER — Encounter: Payer: Self-pay | Admitting: Family Medicine

## 2020-07-16 NOTE — Telephone Encounter (Signed)
Happy to help!

## 2020-07-16 NOTE — Telephone Encounter (Signed)
Faythe Ghee' d per [place]. Please schedule at pt's convenience

## 2020-09-17 ENCOUNTER — Encounter: Payer: Self-pay | Admitting: Family Medicine

## 2020-09-22 ENCOUNTER — Other Ambulatory Visit: Payer: Self-pay

## 2020-09-22 ENCOUNTER — Encounter: Payer: Self-pay | Admitting: Family Medicine

## 2020-09-22 ENCOUNTER — Telehealth: Payer: Self-pay

## 2020-09-22 ENCOUNTER — Ambulatory Visit (INDEPENDENT_AMBULATORY_CARE_PROVIDER_SITE_OTHER): Payer: BC Managed Care – PPO | Admitting: Family Medicine

## 2020-09-22 VITALS — BP 100/70 | HR 62 | Temp 97.9°F | Resp 18 | Ht 70.0 in | Wt 182.6 lb

## 2020-09-22 DIAGNOSIS — K589 Irritable bowel syndrome without diarrhea: Secondary | ICD-10-CM

## 2020-09-22 DIAGNOSIS — Z0000 Encounter for general adult medical examination without abnormal findings: Secondary | ICD-10-CM

## 2020-09-22 DIAGNOSIS — Z125 Encounter for screening for malignant neoplasm of prostate: Secondary | ICD-10-CM | POA: Diagnosis not present

## 2020-09-22 DIAGNOSIS — K22719 Barrett's esophagus with dysplasia, unspecified: Secondary | ICD-10-CM | POA: Diagnosis not present

## 2020-09-22 DIAGNOSIS — K219 Gastro-esophageal reflux disease without esophagitis: Secondary | ICD-10-CM | POA: Diagnosis not present

## 2020-09-22 LAB — CBC WITH DIFFERENTIAL/PLATELET
Basophils Absolute: 0 10*3/uL (ref 0.0–0.1)
Basophils Relative: 0.6 % (ref 0.0–3.0)
Eosinophils Absolute: 0.1 10*3/uL (ref 0.0–0.7)
Eosinophils Relative: 0.8 % (ref 0.0–5.0)
HCT: 41.8 % (ref 39.0–52.0)
Hemoglobin: 14.2 g/dL (ref 13.0–17.0)
Lymphocytes Relative: 24.6 % (ref 12.0–46.0)
Lymphs Abs: 1.8 10*3/uL (ref 0.7–4.0)
MCHC: 34 g/dL (ref 30.0–36.0)
MCV: 89.1 fl (ref 78.0–100.0)
Monocytes Absolute: 0.5 10*3/uL (ref 0.1–1.0)
Monocytes Relative: 6.3 % (ref 3.0–12.0)
Neutro Abs: 4.9 10*3/uL (ref 1.4–7.7)
Neutrophils Relative %: 67.7 % (ref 43.0–77.0)
Platelets: 279 10*3/uL (ref 150.0–400.0)
RBC: 4.69 Mil/uL (ref 4.22–5.81)
RDW: 12.1 % (ref 11.5–15.5)
WBC: 7.2 10*3/uL (ref 4.0–10.5)

## 2020-09-22 LAB — PSA: PSA: 0.52 ng/mL (ref 0.10–4.00)

## 2020-09-22 LAB — TSH: TSH: 1.94 u[IU]/mL (ref 0.35–4.50)

## 2020-09-22 MED ORDER — DIPHENOXYLATE-ATROPINE 2.5-0.025 MG PO TABS
1.0000 | ORAL_TABLET | Freq: Four times a day (QID) | ORAL | 0 refills | Status: DC | PRN
Start: 2020-09-22 — End: 2022-05-02

## 2020-09-22 MED ORDER — NIZATIDINE 150 MG PO CAPS: 150.0000 mg | ORAL_CAPSULE | Freq: Two times a day (BID) | ORAL | 3 refills | Status: DC

## 2020-09-22 NOTE — Assessment & Plan Note (Signed)
Refill lomotil  Stable

## 2020-09-22 NOTE — Patient Instructions (Signed)

## 2020-09-22 NOTE — Assessment & Plan Note (Signed)
ghm utd Check labs See AVS 

## 2020-09-22 NOTE — Telephone Encounter (Signed)
Opened in ERROR

## 2020-09-22 NOTE — Assessment & Plan Note (Signed)
con't axid

## 2020-09-22 NOTE — Progress Notes (Signed)
Patient ID: Christopher Bell, male    DOB: [birth year]  Age: 60 y.o. MRN: [id number]    Subjective:  Subjective  HPI HOLLY PRING presents for a comprehensive physical examination today. He reports feeling well. He states that he no longer take any 10 mg of Oxycodone HCI mg PO Daily and 5% lidocaine patch for his pain. Pt was at the ED on 04/23/2020 for fall and had broken several ribs. He endorses taking 100 mg of Vitamin C PO Daily and multiple vitamins. He denies any chest pain, SOB, fever, abdominal pain, cough, chills, sore throat, dysuria, urinary incontinence, back pain, HA, or N/V/D at this time. He agrees to COVID booster and shingles vaccine but not today He also needs  A refill of axid and lomotil    No complaints   Review of Systems  Constitutional: Negative.   HENT: Negative for congestion, ear pain, hearing loss, nosebleeds, postnasal drip, rhinorrhea, sinus pressure, sneezing and tinnitus.   Eyes: Negative for photophobia, discharge, itching and visual disturbance.  Respiratory: Negative.   Cardiovascular: Negative.   Gastrointestinal: Negative for abdominal distention, abdominal pain, anal bleeding, blood in stool and constipation.  Endocrine: Negative.   Genitourinary: Negative.   Musculoskeletal: Negative.   Skin: Negative.   Allergic/Immunologic: Negative.   Neurological: Negative for dizziness, weakness, light-headedness, numbness and headaches.  Psychiatric/Behavioral: Negative for agitation, confusion, decreased concentration, dysphoric mood, sleep disturbance and suicidal ideas. The patient is not nervous/anxious.     History Past Medical History:  Diagnosis Date  . GERD (gastroesophageal reflux disease)     He has no past surgical history on file.   His family history is not on file.He reports that he has never smoked. He has never used smokeless tobacco. He reports that he does not drink alcohol and does not use drugs.  Current Outpatient Medications on File  Prior to Visit  Medication Sig Dispense Refill  . acetaminophen (TYLENOL) 500 MG tablet Take 2 tablets (1,000 mg total) by mouth every 6 (six) hours. 30 tablet 0  . ascorbic acid (VITAMIN C) 100 MG tablet Take 100 mg by mouth daily.    Marland Kitchen GLUCOSAMINE HCL PO Take 1 tablet by mouth daily.    Marland Kitchen ibuprofen (ADVIL) 200 MG tablet You can take 2 to 3 tablets every 6 hours as needed for pain not relieved by plain Tylenol.  You can start this 2 hours after you take your Tylenol dose.  You can buy this over-the-counter at any drugstore.    . lidocaine (LIDODERM) 5 % Place 1 patch onto the skin daily. Remove & Discard patch within 12 hours or as directed by MD 20 patch 0  . methocarbamol (ROBAXIN) 500 MG tablet Take 2 tablets (1,000 mg total) by mouth 3 (three) times daily. 90 tablet 3  . Multiple Vitamin (MULTI-VITAMIN) tablet Take 1 tablet by mouth daily.    . Omega-3 1000 MG CAPS Take 1,000 mg by mouth daily.    . Oxycodone HCl 10 MG TABS You can take one tablet every 6 hours as needed for pain not relieved by Tylenol/acetaminophen, ibuprofen, and Robaxin. 40 tablet 0   No current facility-administered medications on file prior to visit.      Health Maintenance  Topic Date Due  . Pneumococcal Vaccine 64-38 Years old (1 of 4 - PCV13) Never done  . Zoster Vaccines- Shingrix (1 of 2) Never done  . COVID-19 Vaccine (4 - Booster for Pfizer series) 06/28/2020  . INFLUENZA VACCINE  11/17/2020  .  TETANUS/TDAP  02/08/2021  . COLONOSCOPY (Pts 45-16yrs Insurance coverage will need to be confirmed)  02/02/2025  . Hepatitis C Screening  Completed  . HIV Screening  Completed  . HPV VACCINES  Aged Out   Objective:  Objective  Physical Exam Vitals and nursing note reviewed.  Constitutional:      General: He is not in acute distress.    Appearance: He is well-developed. He is not diaphoretic.  HENT:     Head: Normocephalic and atraumatic.     Right Ear: External ear normal.     Left Ear: External ear  normal.     Nose: Nose normal.     Mouth/Throat:     Pharynx: No oropharyngeal exudate.  Eyes:     General:        Right eye: No discharge.        Left eye: No discharge.     Conjunctiva/sclera: Conjunctivae normal.     Pupils: Pupils are equal, round, and reactive to light.  Neck:     Thyroid: No thyromegaly.     Vascular: No JVD.  Cardiovascular:     Rate and Rhythm: Normal rate and regular rhythm.     Heart sounds: No murmur heard. No friction rub. No gallop.   Pulmonary:     Effort: Pulmonary effort is normal. No respiratory distress.     Breath sounds: Normal breath sounds. No wheezing or rales.  Chest:     Chest wall: No tenderness.  Abdominal:     General: Bowel sounds are normal. There is no distension.     Palpations: Abdomen is soft. There is no mass.     Tenderness: There is no abdominal tenderness. There is no guarding or rebound.  Genitourinary:    Comments: Pt prefers no gu exam  Musculoskeletal:        General: No tenderness. Normal range of motion.     Cervical back: Normal range of motion and neck supple.  Lymphadenopathy:     Cervical: No cervical adenopathy.  Skin:    General: Skin is warm and dry.     Coloration: Skin is not pale.     Findings: No erythema or rash.  Neurological:     Mental Status: He is alert and oriented to person, place, and time.     Motor: No abnormal muscle tone.     Deep Tendon Reflexes: Reflexes are normal and symmetric. Reflexes normal.  Psychiatric:        Behavior: Behavior normal.        Thought Content: Thought content normal.        Judgment: Judgment normal.    There were no vitals taken for this visit. Wt Readings from Last 3 Encounters:  04/23/20 185 lb (83.9 kg)  09/20/19 182 lb (82.6 kg)  01/08/19 172 lb 12.8 oz (78.4 kg)     Lab Results  Component Value Date   WBC 8.7 04/26/2020   HGB 12.5 (L) 04/26/2020   HCT 35.5 (L) 04/26/2020   PLT 229 04/26/2020   GLUCOSE 114 (H) 04/26/2020   CHOL 210 (H)  09/20/2019   TRIG 259.0 (H) 09/20/2019   HDL 44.80 09/20/2019   LDLDIRECT 136.0 09/20/2019   ALT 22 04/23/2020   AST 30 04/23/2020   NA 139 04/26/2020   K 3.3 (L) 04/26/2020   CL 101 04/26/2020   CREATININE 1.02 04/26/2020   BUN 6 04/26/2020   CO2 29 04/26/2020   TSH 1.02 09/20/2019   PSA 0.55  09/20/2019   INR 1.0 04/23/2020    DG Chest 2 View  Result Date: 05/13/2020 CLINICAL DATA:  Follow-up pneumothorax EXAM: CHEST - 2 VIEW COMPARISON:  04/27/2020 FINDINGS: The heart size and mediastinal contours are within normal limits. Improved aeration of the left lung base. Small left-sided pleural effusion. No residual left-sided pneumothorax. Multiple displaced left-sided rib fractures. IMPRESSION: 1. Improved aeration of the left lung base. No residual left-sided pneumothorax. 2. Small left-sided pleural effusion. Electronically Signed   By: Davina Poke D.O.   On: 05/13/2020 15:32     Assessment & Plan:  Plan    No orders of the defined types were placed in this encounter.   Problem List Items Addressed This Visit   None    Colonoscopy: Last completed on 02/03/2015, diverticula noted , repeat every 10 years.   Prostate: Last completed on 02/03/2015, results are normal.   Follow-up: No follow-ups on file.   I,Gordon Zheng,acting as a Education administrator for [employer], DO.,have documented all relevant documentation on the behalf of Ann Held, DO,as directed by  Ann Held, DO while in the presence of [place], DO, have reviewed all documentation for this visit. The documentation on 09/22/20 for the exam, diagnosis, procedures, and orders are all accurate and complete.

## 2020-09-23 ENCOUNTER — Encounter: Payer: Self-pay | Admitting: Family Medicine

## 2020-09-23 LAB — LIPID PANEL
Cholesterol: 238 mg/dL — ABNORMAL HIGH (ref 0–200)
HDL: 54.4 mg/dL (ref >39.00)
NonHDL: 183.34
Total CHOL/HDL Ratio: 4
Triglycerides: 237 mg/dL — ABNORMAL HIGH (ref 0.0–149.0)
VLDL: 47.4 mg/dL — ABNORMAL HIGH (ref 0.0–40.0)

## 2020-09-23 LAB — COMPREHENSIVE METABOLIC PANEL
ALT: 15 U/L (ref 0–53)
AST: 20 U/L (ref 0–37)
Albumin: 4.6 g/dL (ref 3.5–5.2)
Alkaline Phosphatase: 76 U/L (ref 39–117)
BUN: 11 mg/dL (ref 6–23)
CO2: 27 mEq/L (ref 19–32)
Calcium: 9.7 mg/dL (ref 8.4–10.5)
Chloride: 103 mEq/L (ref 96–112)
Creatinine, Ser: 0.97 mg/dL (ref 0.40–1.50)
GFR: 85.66 mL/min (ref >60.00)
Glucose, Bld: 83 mg/dL (ref 70–99)
Potassium: 4.4 mEq/L (ref 3.5–5.1)
Sodium: 140 mEq/L (ref 135–145)
Total Bilirubin: 0.5 mg/dL (ref 0.2–1.2)
Total Protein: 7.3 g/dL (ref 6.0–8.3)

## 2020-09-23 LAB — LDL CHOLESTEROL, DIRECT: Direct LDL: 161 mg/dL

## 2020-09-29 ENCOUNTER — Other Ambulatory Visit: Payer: Self-pay | Admitting: Family Medicine

## 2020-09-29 DIAGNOSIS — E785 Hyperlipidemia, unspecified: Secondary | ICD-10-CM

## 2020-11-24 ENCOUNTER — Encounter: Payer: Self-pay | Admitting: Family Medicine

## 2020-11-24 ENCOUNTER — Other Ambulatory Visit: Payer: Self-pay | Admitting: Family Medicine

## 2020-11-24 DIAGNOSIS — K580 Irritable bowel syndrome with diarrhea: Secondary | ICD-10-CM

## 2020-11-24 DIAGNOSIS — K589 Irritable bowel syndrome without diarrhea: Secondary | ICD-10-CM

## 2020-11-24 MED ORDER — LOPERAMIDE HCL 2 MG PO TABS: 2.0000 | ORAL_TABLET | Freq: Three times a day (TID) | ORAL | 0 refills | Status: DC

## 2020-11-26 ENCOUNTER — Other Ambulatory Visit: Payer: Self-pay | Admitting: Family Medicine

## 2020-11-26 MED ORDER — LOPERAMIDE HCL 2 MG PO TABS: 2.0000 | ORAL_TABLET | Freq: Three times a day (TID) | ORAL | 0 refills | Status: DC

## 2020-12-05 DIAGNOSIS — K219 Gastro-esophageal reflux disease without esophagitis: Secondary | ICD-10-CM | POA: Diagnosis not present

## 2020-12-05 DIAGNOSIS — K2270 Barrett's esophagus without dysplasia: Secondary | ICD-10-CM | POA: Diagnosis not present

## 2020-12-05 DIAGNOSIS — K317 Polyp of stomach and duodenum: Secondary | ICD-10-CM | POA: Diagnosis not present

## 2020-12-05 DIAGNOSIS — Z86010 Personal history of colonic polyps: Secondary | ICD-10-CM | POA: Diagnosis not present

## 2020-12-17 ENCOUNTER — Other Ambulatory Visit: Payer: Self-pay | Admitting: Family Medicine

## 2021-02-16 ENCOUNTER — Other Ambulatory Visit: Payer: Self-pay | Admitting: Family Medicine

## 2021-03-25 ENCOUNTER — Other Ambulatory Visit: Payer: Self-pay | Admitting: Family Medicine

## 2021-05-28 ENCOUNTER — Telehealth (INDEPENDENT_AMBULATORY_CARE_PROVIDER_SITE_OTHER): Payer: 59 | Admitting: Family Medicine

## 2021-05-28 ENCOUNTER — Encounter: Payer: Self-pay | Admitting: Family Medicine

## 2021-05-28 DIAGNOSIS — U071 COVID-19: Secondary | ICD-10-CM | POA: Diagnosis not present

## 2021-05-28 MED ORDER — PREDNISONE 20 MG PO TABS: 40.0000 mg | ORAL_TABLET | Freq: Every day | ORAL | 0 refills | Status: AC

## 2021-05-28 NOTE — Patient Instructions (Signed)

## 2021-05-28 NOTE — Progress Notes (Signed)
Virtual Video Visit via MyChart Note  I connected with  [name] on 05/28/21 at 11:20 AM EST by the video enabled telemedicine application for MyChart, and verified that I am speaking with the correct person using two identifiers.   I introduced myself as a Designer, jewellery with the practice. We discussed the limitations of evaluation and management by telemedicine and the availability of in person appointments. The patient expressed understanding and agreed to proceed.  Participating parties in this visit include: The patient and the nurse practitioner listed.   The patient is: At home I am: In the office - [facility]  Subjective:    CC:  Chief Complaint  Patient presents with   Covid Positive    HPI: Christopher Bell is a 61 y.o. year old male presenting today via [place] today for [place] +.  Patient started feeling like he had a cold on Sunday (5 days ago), but woke up feeling worse the next day. Symptoms have included productive cough, rhinorrhea, low grade fever, body aches, chills, headaches. He denies any nausea, vomiting, diarrhea, chest pain, dyspnea, wheezing. Tested positive for COVID on Monday.  So far he has been taking zinc, vitamin c, tylenol.      Past medical history, Surgical history, Family history not pertinant except as noted below, Social history, Allergies, and medications have been entered into the medical record, reviewed, and corrections made.   Review of Systems:  All review of systems negative except what is listed in the HPI   Objective:    General:  Speaking clearly in complete sentences. Absent shortness of breath noted.   Alert and oriented x3.   Normal judgment.  Absent acute distress.   Impression and Recommendations:    1. COVID-19 - predniSONE (DELTASONE) 20 MG tablet; Take 2 tablets (40 mg total) by mouth daily with breakfast for 5 days.  Dispense: 10 tablet; Refill: 0  Patient is not high  risk for complications. Starting with prednisone to help with symptoms. Offered other medications including cough medication, flonase, mucinex, etc. but declined stating they have plenty of OTC medications to try. Continue supportive measures including rest, hydration, humidifier use, steam showers, warm compresses to sinuses, warm liquids with lemon and honey, and over-the-counter cough, cold, and analgesics as needed.  Attaching OTC list and quarantine guidelines to AVS. If symptoms persist past 10 days or get severe before then, consider secondary infection and antibiotics.    Follow-up if symptoms worsen or fail to improve.    I discussed the assessment and treatment plan with the patient. The patient was provided an opportunity to ask questions and all were answered. The patient agreed with the plan and demonstrated an understanding of the instructions.   The patient was advised to call back or seek an in-person evaluation if the symptoms worsen or if the condition fails to improve as anticipated.  I spent 20 minutes dedicated to the care of this patient on the date of this encounter to include pre-visit chart review of prior notes and results, face-to-face time with the patient, and post-visit ordering of testing as indicated.   Terrilyn Saver, NP

## 2021-08-09 ENCOUNTER — Encounter: Payer: Self-pay | Admitting: Family Medicine

## 2021-08-10 MED ORDER — OMEPRAZOLE 20 MG PO CPDR
DELAYED_RELEASE_CAPSULE | ORAL | 1 refills | Status: DC
Start: 2021-08-10 — End: 2021-11-02

## 2021-08-10 NOTE — Telephone Encounter (Signed)
Okay to send in? Medication not on med list ?

## 2021-08-21 IMAGING — DX DG CHEST 1V PORT
1 series · 1 of 1 positions shown · non-contrast
Comparison: Yesterday

CLINICAL DATA: Closed traumatic fracture of ribs on the left with
pneumothorax

EXAM:
PORTABLE CHEST 1 VIEW

[chest]
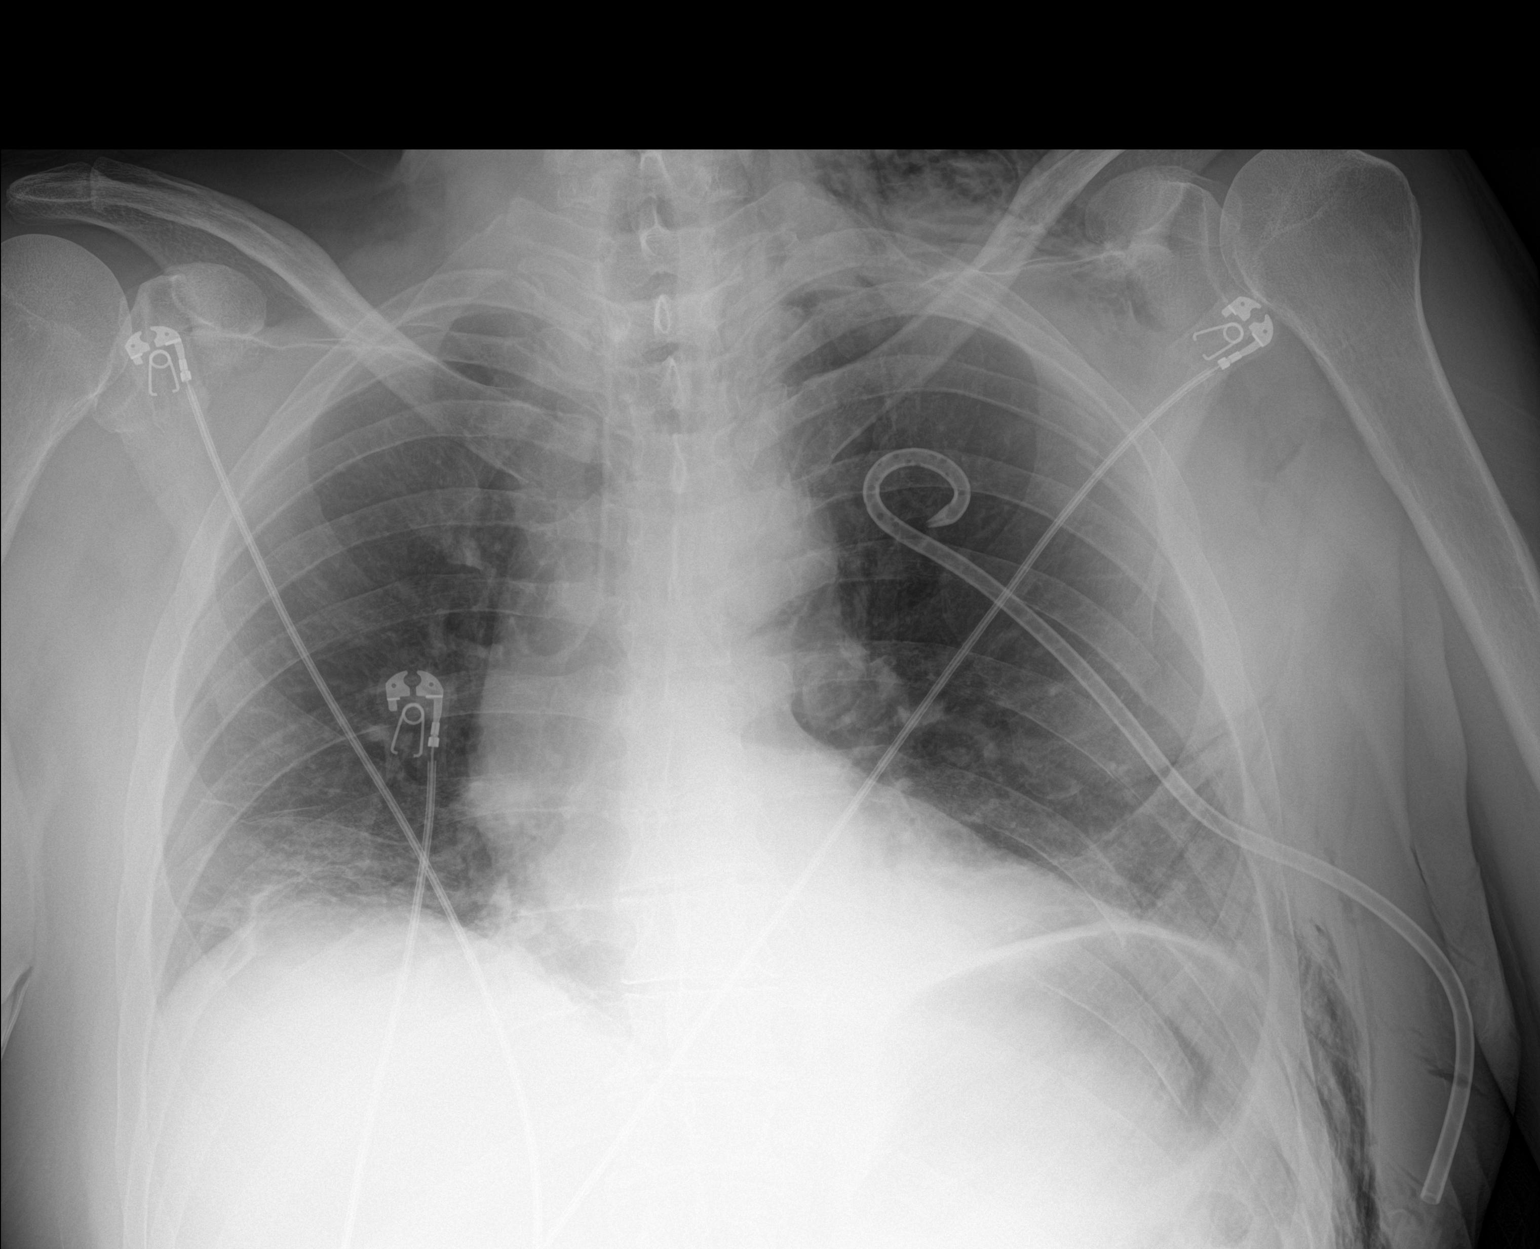

[1 of 1 positions shown; findings below may reference images not displayed]

FINDINGS: Left chest tube in stable position. Trace left apical pneumothorax.
Similar degree of chest wall emphysema. Atelectasis at the lung
bases. Normal heart size and aortic contours for technique.
IMPRESSION: 1. Unchanged trace left apical pneumothorax.
2. Atelectasis at the lung bases.

## 2021-08-24 ENCOUNTER — Other Ambulatory Visit: Payer: Self-pay | Admitting: Family Medicine

## 2021-08-24 IMAGING — DX DG CHEST 1V PORT
1 series · 1 of 1 positions shown · non-contrast
Comparison: Radiograph 04/26/2020, CT 04/23/2020

CLINICAL DATA: Pneumothorax follow-up

EXAM:
PORTABLE CHEST 1 VIEW

[chest ap]
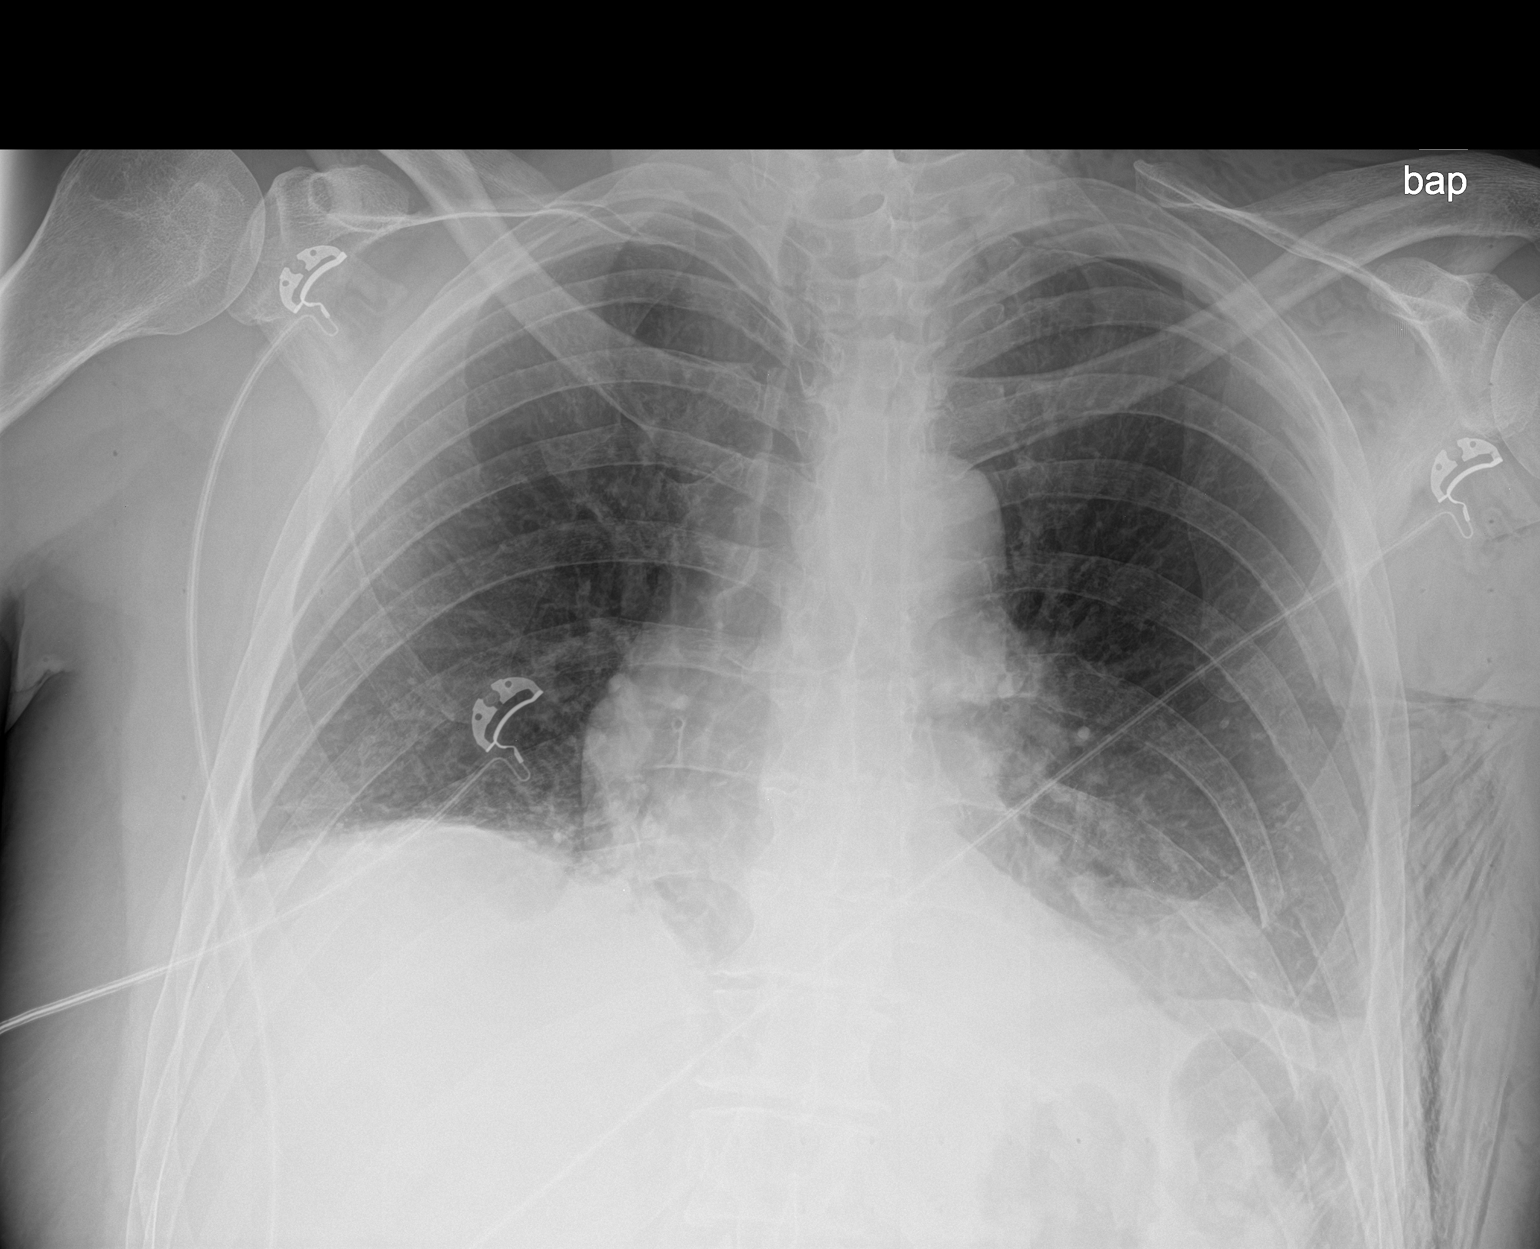

[1 of 1 positions shown; findings below may reference images not displayed]

FINDINGS: A persistent small left apical pneumothorax is not significantly
changed in size from comparison imaging. Suspect at least trace
pleural fluid on the left as well. Additional patchy opacities in
both lungs likely reflect a combination of pulmonary contusion and
atelectasis. Redemonstration of multiple displaced left rib
fractures as well as extensive subcutaneous emphysema extending
across the left chest wall and base of the neck. Stable
cardiomediastinal contours. Upper abdomen is unremarkable. Telemetry
leads overlie the chest.
IMPRESSION: 1. Unchanged small left apical pneumothorax and likely trace left
pleural fluid with multiple displaced left rib fractures and
extensive subcutaneous emphysema extending across the left chest
wall and base of the neck.
2. Patchy opacities in both lungs likely reflect a combination of
pulmonary contusion and atelectasis.

## 2021-08-24 MED ORDER — LOPERAMIDE HCL 2 MG PO CAPS: 4.0000 mg | ORAL_CAPSULE | Freq: Three times a day (TID) | ORAL | 1 refills | Status: DC

## 2021-09-24 ENCOUNTER — Encounter: Payer: BC Managed Care – PPO | Admitting: Family Medicine

## 2021-09-25 ENCOUNTER — Encounter: Payer: BC Managed Care – PPO | Admitting: Family Medicine

## 2021-10-30 ENCOUNTER — Encounter: Payer: Self-pay | Admitting: Family Medicine

## 2021-11-02 MED ORDER — OMEPRAZOLE 40 MG PO CPDR: 40.0000 mg | DELAYED_RELEASE_CAPSULE | Freq: Every day | ORAL | 3 refills | Status: DC

## 2022-02-16 ENCOUNTER — Encounter: Payer: Self-pay | Admitting: Family Medicine

## 2022-02-16 DIAGNOSIS — K589 Irritable bowel syndrome without diarrhea: Secondary | ICD-10-CM

## 2022-02-16 MED ORDER — LOPERAMIDE HCL 2 MG PO CAPS: 4.0000 mg | ORAL_CAPSULE | Freq: Three times a day (TID) | ORAL | 0 refills | Status: DC

## 2022-02-18 ENCOUNTER — Ambulatory Visit: Payer: 59 | Admitting: Family Medicine

## 2022-02-25 ENCOUNTER — Ambulatory Visit: Payer: 59 | Admitting: Family Medicine

## 2022-03-22 ENCOUNTER — Other Ambulatory Visit: Payer: Self-pay | Admitting: Family Medicine

## 2022-04-02 ENCOUNTER — Encounter: Payer: 59 | Admitting: Family Medicine

## 2022-04-09 ENCOUNTER — Other Ambulatory Visit: Payer: Self-pay | Admitting: Family Medicine

## 2022-04-09 MED ORDER — LOPERAMIDE HCL 2 MG PO CAPS
4.0000 mg | ORAL_CAPSULE | Freq: Three times a day (TID) | ORAL | 0 refills | Status: DC
Start: 2022-04-09 — End: 2022-04-30

## 2022-04-30 ENCOUNTER — Ambulatory Visit (INDEPENDENT_AMBULATORY_CARE_PROVIDER_SITE_OTHER): Payer: 59 | Admitting: Family Medicine

## 2022-04-30 ENCOUNTER — Encounter: Payer: Self-pay | Admitting: Family Medicine

## 2022-04-30 VITALS — BP 110/68 | HR 63 | Temp 98.1°F | Resp 18 | Ht 70.0 in | Wt 184.0 lb

## 2022-04-30 DIAGNOSIS — K219 Gastro-esophageal reflux disease without esophagitis: Secondary | ICD-10-CM | POA: Diagnosis not present

## 2022-04-30 DIAGNOSIS — Z23 Encounter for immunization: Secondary | ICD-10-CM

## 2022-04-30 DIAGNOSIS — Z0000 Encounter for general adult medical examination without abnormal findings: Secondary | ICD-10-CM

## 2022-04-30 MED ORDER — OMEPRAZOLE 40 MG PO CPDR: 40.0000 mg | DELAYED_RELEASE_CAPSULE | Freq: Every day | ORAL | 3 refills | Status: DC

## 2022-04-30 MED ORDER — LOPERAMIDE HCL 2 MG PO CAPS: 4.0000 mg | ORAL_CAPSULE | Freq: Three times a day (TID) | ORAL | 0 refills | Status: DC

## 2022-04-30 NOTE — Progress Notes (Signed)
Subjective:   By signing my name below, I, Shehryar Baig, attest that this documentation has been prepared under the direction and in the presence of Ann Held, DO. 04/30/2022   Patient ID: Christopher Bell, male    DOB: 08-03-1960, 62 y.o.   MRN: [id number]  No chief complaint on file.   HPI Patient is in today for a comprehensive physical exam.  He was sick a month ago but managed with OTC medication. He needs a refill for Prilosec and Lomotil.  He denies having any fever, new moles, congestion, sore throat, new muscle pain, new joint pain, chest pain, cough, SOB, wheezing, n/v/d, constipation, blood in stool, dysuria, frequency, hematuria, or headaches at this time.  He had a Zoster vaccine in 2017 but not the new version. He is interested in receiving both a tetanus vaccine and the first dose of the Zoster vaccine.  He had a recent toe injury from stubbing it on an object that has resolved. He denies any changes in family medical history.  Colonoscopy: Last completed on 12/05/2020. Repeat in 10 years.   Past Medical History:  Diagnosis Date   GERD (gastroesophageal reflux disease)     No past surgical history on file.  No family history on file.  Social History   Socioeconomic History   Marital status: Married    Spouse name: Not on file   Number of children: Not on file   Years of education: Not on file   Highest education level: Not on file  Occupational History   Occupation: SALES MANAGER  Tobacco Use   Smoking status: Never   Smokeless tobacco: Never  Substance and Sexual Activity   Alcohol use: No   Drug use: No   Sexual activity: Not on file  Other Topics Concern   Not on file  Social History Narrative   Exercise-no-walks a lot at work      Social Determinants of Radio broadcast assistant Strain: Not on file  Food Insecurity: Not on file  Transportation Needs: Not on file  Physical Activity: Not on file  Stress: Not on file  Social  Connections: Not on file  Intimate Partner Violence: Not on file    Outpatient Medications Prior to Visit  Medication Sig Dispense Refill   loperamide (IMODIUM) 2 MG capsule Take 2 capsules (4 mg total) by mouth 3 (three) times daily. 180 capsule 0   acetaminophen (TYLENOL) 500 MG tablet Take 2 tablets (1,000 mg total) by mouth every 6 (six) hours. 30 tablet 0   ascorbic acid (VITAMIN C) 100 MG tablet Take 100 mg by mouth daily.     diphenoxylate-atropine (LOMOTIL) 2.5-0.025 MG tablet Take 1 tablet by mouth 4 (four) times daily as needed for diarrhea or loose stools. 30 tablet 0   GLUCOSAMINE HCL PO Take 1 tablet by mouth daily.     ibuprofen (ADVIL) 200 MG tablet You can take 2 to 3 tablets every 6 hours as needed for pain not relieved by plain Tylenol.  You can start this 2 hours after you take your Tylenol dose.  You can buy this over-the-counter at any drugstore.     Multiple Vitamin (MULTI-VITAMIN) tablet Take 1 tablet by mouth daily.     nizatidine (AXID) 150 MG capsule Take 1 capsule (150 mg total) by mouth 2 (two) times daily. 180 capsule 3   omeprazole (PRILOSEC) 40 MG capsule Take 1 capsule (40 mg total) by mouth daily. 90 capsule 3  No facility-administered medications prior to visit.    No Known Allergies  Review of Systems  Constitutional:  Negative for fever.  HENT:  Negative for congestion, sinus pain and sore throat.   Respiratory:  Negative for cough, shortness of breath and wheezing.   Cardiovascular:  Negative for chest pain and palpitations.  Gastrointestinal:  Negative for abdominal pain, constipation, diarrhea, nausea and vomiting.  Genitourinary:  Negative for dysuria, frequency and hematuria.  Musculoskeletal:        (-)new muscle pain (-)new joint pain  Skin:        (-)New moles (+)Small laceration on right 5th finger  Neurological:  Negative for headaches.       Objective:    Physical Exam Constitutional:      General: He is not in acute  distress.    Appearance: Normal appearance. He is not ill-appearing.  HENT:     Head: Normocephalic and atraumatic.     Right Ear: Tympanic membrane, ear canal and external ear normal.     Left Ear: Tympanic membrane, ear canal and external ear normal.     Mouth/Throat:     Mouth: Mucous membranes are moist.     Pharynx: Oropharynx is clear.  Eyes:     Extraocular Movements: Extraocular movements intact.     Pupils: Pupils are equal, round, and reactive to light.  Cardiovascular:     Rate and Rhythm: Normal rate and regular rhythm.     Heart sounds: Normal heart sounds. No murmur heard.    No gallop.  Pulmonary:     Effort: Pulmonary effort is normal. No respiratory distress.     Breath sounds: Normal breath sounds. No wheezing or rales.  Abdominal:     General: There is no distension.     Palpations: Abdomen is soft.     Tenderness: There is no abdominal tenderness. There is no guarding.  Skin:    General: Skin is warm and dry.  Neurological:     Mental Status: He is alert and oriented to person, place, and time.  Psychiatric:        Judgment: Judgment normal.     There were no vitals taken for this visit. Wt Readings from Last 3 Encounters:  09/22/20 182 lb 9.6 oz (82.8 kg)  04/23/20 185 lb (83.9 kg)  09/20/19 182 lb (82.6 kg)       Assessment & Plan:  There are no diagnoses linked to this encounter.  I, Shehryar [place], personally preformed the services described in this documentation.  All medical record entries made by the scribe were at my direction and in my presence.  I have reviewed the chart and discharge instructions (if applicable) and agree that the record reflects my personal performance and is accurate and complete. 04/30/2022   I,Shehryar Baig,acting as a scribe for Ann Held, DO.,have documented all relevant documentation on the behalf of Ann Held, DO,as directed by  Ann Held, DO while in the presence of Ann Held, DO.   Shehryar [employer]

## 2022-05-01 LAB — CBC WITH DIFFERENTIAL/PLATELET
Absolute Monocytes: 542 cells/uL (ref 200–950)
Basophils Absolute: 52 cells/uL (ref 0–200)
Basophils Relative: 0.6 %
Eosinophils Absolute: 120 cells/uL (ref 15–500)
Eosinophils Relative: 1.4 %
HCT: 37.4 % — ABNORMAL LOW (ref 38.5–50.0)
Hemoglobin: 13 g/dL — ABNORMAL LOW (ref 13.2–17.1)
Lymphs Abs: 3113 cells/uL (ref 850–3900)
MCH: 30.9 pg (ref 27.0–33.0)
MCHC: 34.8 g/dL (ref 32.0–36.0)
MCV: 88.8 fL (ref 80.0–100.0)
MPV: 10.5 fL (ref 7.5–12.5)
Monocytes Relative: 6.3 %
Neutro Abs: 4773 cells/uL (ref 1500–7800)
Neutrophils Relative %: 55.5 %
Platelets: 280 10*3/uL (ref 140–400)
RBC: 4.21 10*6/uL (ref 4.20–5.80)
RDW: 12.3 % (ref 11.0–15.0)
Total Lymphocyte: 36.2 %
WBC: 8.6 10*3/uL (ref 3.8–10.8)

## 2022-05-01 LAB — COMPREHENSIVE METABOLIC PANEL
AG Ratio: 1.9 (calc) (ref 1.0–2.5)
ALT: 15 U/L (ref 9–46)
AST: 20 U/L (ref 10–35)
Albumin: 4.5 g/dL (ref 3.6–5.1)
Alkaline phosphatase (APISO): 69 U/L (ref 35–144)
BUN: 16 mg/dL (ref 7–25)
CO2: 23 mmol/L (ref 20–32)
Calcium: 9.2 mg/dL (ref 8.6–10.3)
Chloride: 105 mmol/L (ref 98–110)
Creat: 1.03 mg/dL (ref 0.70–1.35)
Globulin: 2.4 g/dL (calc) (ref 1.9–3.7)
Glucose, Bld: 92 mg/dL (ref 65–99)
Potassium: 4.2 mmol/L (ref 3.5–5.3)
Sodium: 140 mmol/L (ref 135–146)
Total Bilirubin: 0.3 mg/dL (ref 0.2–1.2)
Total Protein: 6.9 g/dL (ref 6.1–8.1)

## 2022-05-01 LAB — LIPID PANEL
Cholesterol: 228 mg/dL — ABNORMAL HIGH (ref <200)
HDL: 56 mg/dL (ref >=40)
LDL Cholesterol (Calc): 139 mg/dL (calc) — ABNORMAL HIGH
Non-HDL Cholesterol (Calc): 172 mg/dL (calc) — ABNORMAL HIGH (ref <130)
Total CHOL/HDL Ratio: 4.1 (calc) (ref <5.0)
Triglycerides: 193 mg/dL — ABNORMAL HIGH (ref <150)

## 2022-05-01 LAB — PSA: PSA: 0.68 ng/mL (ref <=4.00)

## 2022-05-01 LAB — TSH: TSH: 1.36 mIU/L (ref 0.40–4.50)

## 2022-05-02 ENCOUNTER — Other Ambulatory Visit (INDEPENDENT_AMBULATORY_CARE_PROVIDER_SITE_OTHER): Payer: 59 | Admitting: Family Medicine

## 2022-05-02 DIAGNOSIS — E785 Hyperlipidemia, unspecified: Secondary | ICD-10-CM

## 2022-05-02 DIAGNOSIS — Z0000 Encounter for general adult medical examination without abnormal findings: Secondary | ICD-10-CM | POA: Diagnosis not present

## 2022-05-02 NOTE — Assessment & Plan Note (Signed)
Ghm utd Check labs  See avs 

## 2022-06-07 ENCOUNTER — Encounter: Payer: Self-pay | Admitting: Family Medicine

## 2022-06-07 ENCOUNTER — Other Ambulatory Visit: Payer: Self-pay | Admitting: Family Medicine

## 2022-06-07 MED ORDER — LOPERAMIDE HCL 2 MG PO CAPS: 4.0000 mg | ORAL_CAPSULE | Freq: Three times a day (TID) | ORAL | 1 refills | Status: DC

## 2022-07-23 ENCOUNTER — Ambulatory Visit (INDEPENDENT_AMBULATORY_CARE_PROVIDER_SITE_OTHER): Payer: 59

## 2022-07-23 DIAGNOSIS — Z23 Encounter for immunization: Secondary | ICD-10-CM

## 2022-07-28 ENCOUNTER — Other Ambulatory Visit: Payer: Self-pay | Admitting: Family Medicine

## 2022-07-29 ENCOUNTER — Encounter: Payer: Self-pay | Admitting: Family Medicine

## 2022-07-30 ENCOUNTER — Other Ambulatory Visit: Payer: Self-pay | Admitting: Family Medicine

## 2022-07-30 MED ORDER — LOPERAMIDE HCL 2 MG PO TABS: 2.0000 | ORAL_TABLET | Freq: Three times a day (TID) | ORAL | 1 refills | Status: DC

## 2022-11-06 ENCOUNTER — Other Ambulatory Visit: Payer: Self-pay | Admitting: Family Medicine

## 2022-11-08 ENCOUNTER — Encounter: Payer: Self-pay | Admitting: *Deleted

## 2022-11-08 NOTE — Telephone Encounter (Signed)
Pt states he does not see the point in a 6 month follow up and hge would like someone clinical to go into more detail as to why this appt is important.

## 2022-12-03 ENCOUNTER — Telehealth: Payer: Self-pay | Admitting: Family Medicine

## 2022-12-03 MED ORDER — LOPERAMIDE HCL 2 MG PO TABS: 2.0000 | ORAL_TABLET | Freq: Three times a day (TID) | ORAL | 1 refills | Status: DC

## 2022-12-03 NOTE — Telephone Encounter (Signed)
Refill sent.

## 2022-12-03 NOTE — Telephone Encounter (Signed)
Pt is requesting enough refills to get him to his next cpe 05/13/23.   Medication: loperamide (IMODIUM A-D) 2 MG tablet   Has the patient contacted their pharmacy? Yes.     Preferred Pharmacy:   [facility] [address], [place] - [address] [place] [address] Phone: [phone]  Fax: [phone]

## 2023-03-09 ENCOUNTER — Other Ambulatory Visit (HOSPITAL_BASED_OUTPATIENT_CLINIC_OR_DEPARTMENT_OTHER): Payer: Self-pay

## 2023-03-09 ENCOUNTER — Ambulatory Visit (INDEPENDENT_AMBULATORY_CARE_PROVIDER_SITE_OTHER): Payer: 59 | Admitting: Physician Assistant

## 2023-03-09 VITALS — BP 119/80 | HR 70 | Resp 18 | Ht 70.0 in | Wt 187.4 lb

## 2023-03-09 DIAGNOSIS — J0110 Acute frontal sinusitis, unspecified: Secondary | ICD-10-CM

## 2023-03-09 DIAGNOSIS — R051 Acute cough: Secondary | ICD-10-CM | POA: Diagnosis not present

## 2023-03-09 MED ORDER — AMOXICILLIN-POT CLAVULANATE 875-125 MG PO TABS
1.0000 | ORAL_TABLET | Freq: Two times a day (BID) | ORAL | 0 refills | Status: DC
  Filled 2023-03-09: qty 14, 7d supply, fill #0

## 2023-03-09 MED ORDER — PROMETHAZINE-DM 6.25-15 MG/5ML PO SYRP
5.0000 mL | ORAL_SOLUTION | Freq: Four times a day (QID) | ORAL | 0 refills | Status: DC | PRN
  Filled 2023-03-09: qty 118, 6d supply, fill #0

## 2023-03-09 NOTE — Progress Notes (Signed)
      Established patient visit   Patient: Christopher Bell   DOB: [birth year]   62 y.o. Male  MRN: [id number] Visit Date: 03/09/2023  Today's healthcare provider: Alfredia Ferguson, PA-C   Chief Complaint  Patient presents with   Cough    Cough onset one week, fatigue, no fever  Pt states he has a lot of drainage  Tested COVID negative 03/06/2023   Subjective     Pt reports sinus congestion, pnd, cough, sinus pressure for the last 7 days. Denies chest pain, sob.  Medications: Outpatient Medications Prior to Visit  Medication Sig   loperamide (IMODIUM A-D) 2 MG tablet Take 2 tablets (4 mg total) by mouth 3 (three) times daily.   omeprazole (PRILOSEC) 40 MG capsule Take 1 capsule (40 mg total) by mouth daily.   No facility-administered medications prior to visit.    Review of Systems  Constitutional:  Positive for fatigue. Negative for fever.  HENT:  Positive for postnasal drip, rhinorrhea and sinus pressure.   Respiratory:  Positive for cough. Negative for shortness of breath.   Cardiovascular:  Negative for chest pain, palpitations and leg swelling.  Neurological:  Negative for dizziness and headaches.       Objective    BP 119/80 (BP Location: Left Arm, Patient Position: Sitting, Cuff Size: Normal)   Pulse 70   Resp 18   Ht 5\' 10"  (1.778 m)   Wt 187 lb 6.4 oz (85 kg)   SpO2 96%   BMI 26.89 kg/m    Physical Exam Constitutional:      General: He is awake.     Appearance: He is well-developed.  HENT:     Head: Normocephalic.     Right Ear: Tympanic membrane normal.     Left Ear: Tympanic membrane normal.     Mouth/Throat:     Pharynx: Posterior oropharyngeal erythema present. No oropharyngeal exudate.  Eyes:     Conjunctiva/sclera: Conjunctivae normal.  Cardiovascular:     Rate and Rhythm: Normal rate and regular rhythm.     Heart sounds: Normal heart sounds.  Pulmonary:     Effort: Pulmonary effort is normal.     Breath sounds: Normal breath sounds.   Skin:    General: Skin is warm.  Neurological:     Mental Status: He is alert and oriented to person, place, and time.  Psychiatric:        Attention and Perception: Attention normal.        Mood and Affect: Mood normal.        Speech: Speech normal.        Behavior: Behavior is cooperative.      No results found for any visits on 03/09/23.  Assessment & Plan    Acute non-recurrent frontal sinusitis -     Amoxicillin-Pot Clavulanate; Take 1 tablet by mouth 2 (two) times daily for 7 days.  Dispense: 14 tablet; Refill: 0  Acute cough -     Promethazine-DM; Take 5 mLs by mouth 4 (four) times daily as needed for cough.  Dispense: 118 mL; Refill: 0   Advised hydration, rest.  Refilled cough syrup, rx augmentin bid x 7 days.  Return if symptoms worsen or fail to improve.       Alfredia Ferguson, PA-C  [facility] [phone] (phone) [phone] (fax)  [facility]

## 2023-03-14 ENCOUNTER — Other Ambulatory Visit: Payer: Self-pay | Admitting: Physician Assistant

## 2023-03-14 ENCOUNTER — Encounter: Payer: Self-pay | Admitting: Physician Assistant

## 2023-03-14 DIAGNOSIS — J0110 Acute frontal sinusitis, unspecified: Secondary | ICD-10-CM

## 2023-03-14 DIAGNOSIS — R051 Acute cough: Secondary | ICD-10-CM

## 2023-03-15 ENCOUNTER — Other Ambulatory Visit: Payer: Self-pay | Admitting: Physician Assistant

## 2023-03-15 ENCOUNTER — Other Ambulatory Visit (HOSPITAL_BASED_OUTPATIENT_CLINIC_OR_DEPARTMENT_OTHER): Payer: Self-pay

## 2023-03-15 DIAGNOSIS — J0110 Acute frontal sinusitis, unspecified: Secondary | ICD-10-CM

## 2023-03-15 DIAGNOSIS — R051 Acute cough: Secondary | ICD-10-CM

## 2023-03-15 MED ORDER — DOXYCYCLINE HYCLATE 100 MG PO TABS: 100.0000 mg | ORAL_TABLET | Freq: Two times a day (BID) | ORAL | 0 refills | Status: AC

## 2023-03-15 MED ORDER — PROMETHAZINE-DM 6.25-15 MG/5ML PO SYRP: 5.0000 mL | ORAL_SOLUTION | Freq: Four times a day (QID) | ORAL | 0 refills | Status: DC | PRN

## 2023-03-28 ENCOUNTER — Other Ambulatory Visit: Payer: Self-pay | Admitting: Family Medicine

## 2023-05-13 ENCOUNTER — Encounter: Payer: Self-pay | Admitting: Family Medicine

## 2023-05-13 ENCOUNTER — Other Ambulatory Visit: Payer: Self-pay | Admitting: Family Medicine

## 2023-05-13 ENCOUNTER — Ambulatory Visit (INDEPENDENT_AMBULATORY_CARE_PROVIDER_SITE_OTHER): Payer: 59 | Admitting: Family Medicine

## 2023-05-13 VITALS — BP 108/80 | HR 80 | Temp 97.8°F | Resp 18 | Ht 70.0 in | Wt 188.0 lb

## 2023-05-13 DIAGNOSIS — K219 Gastro-esophageal reflux disease without esophagitis: Secondary | ICD-10-CM | POA: Diagnosis not present

## 2023-05-13 DIAGNOSIS — Z0000 Encounter for general adult medical examination without abnormal findings: Secondary | ICD-10-CM

## 2023-05-13 DIAGNOSIS — K589 Irritable bowel syndrome without diarrhea: Secondary | ICD-10-CM | POA: Diagnosis not present

## 2023-05-13 LAB — LIPID PANEL
Cholesterol: 224 mg/dL — ABNORMAL HIGH (ref 0–200)
HDL: 51.8 mg/dL (ref >39.00)
LDL Cholesterol: 140 mg/dL — ABNORMAL HIGH (ref 0–99)
NonHDL: 172.33
Total CHOL/HDL Ratio: 4
Triglycerides: 163 mg/dL — ABNORMAL HIGH (ref 0.0–149.0)
VLDL: 32.6 mg/dL (ref 0.0–40.0)

## 2023-05-13 LAB — COMPREHENSIVE METABOLIC PANEL
ALT: 15 U/L (ref 0–53)
AST: 19 U/L (ref 0–37)
Albumin: 4.4 g/dL (ref 3.5–5.2)
Alkaline Phosphatase: 61 U/L (ref 39–117)
BUN: 13 mg/dL (ref 6–23)
CO2: 29 meq/L (ref 19–32)
Calcium: 9.1 mg/dL (ref 8.4–10.5)
Chloride: 104 meq/L (ref 96–112)
Creatinine, Ser: 0.96 mg/dL (ref 0.40–1.50)
GFR: 85.14 mL/min (ref >60.00)
Glucose, Bld: 96 mg/dL (ref 70–99)
Potassium: 4.4 meq/L (ref 3.5–5.1)
Sodium: 139 meq/L (ref 135–145)
Total Bilirubin: 0.6 mg/dL (ref 0.2–1.2)
Total Protein: 6.8 g/dL (ref 6.0–8.3)

## 2023-05-13 LAB — CBC WITH DIFFERENTIAL/PLATELET
Basophils Absolute: 0 10*3/uL (ref 0.0–0.1)
Basophils Relative: 0.7 % (ref 0.0–3.0)
Eosinophils Absolute: 0.1 10*3/uL (ref 0.0–0.7)
Eosinophils Relative: 1.6 % (ref 0.0–5.0)
HCT: 39.5 % (ref 39.0–52.0)
Hemoglobin: 13.6 g/dL (ref 13.0–17.0)
Lymphocytes Relative: 26.9 % (ref 12.0–46.0)
Lymphs Abs: 1.6 10*3/uL (ref 0.7–4.0)
MCHC: 34.5 g/dL (ref 30.0–36.0)
MCV: 89.5 fL (ref 78.0–100.0)
Monocytes Absolute: 0.4 10*3/uL (ref 0.1–1.0)
Monocytes Relative: 6.6 % (ref 3.0–12.0)
Neutro Abs: 3.7 10*3/uL (ref 1.4–7.7)
Neutrophils Relative %: 64.2 % (ref 43.0–77.0)
Platelets: 268 10*3/uL (ref 150.0–400.0)
RBC: 4.41 Mil/uL (ref 4.22–5.81)
RDW: 12.8 % (ref 11.5–15.5)
WBC: 5.8 10*3/uL (ref 4.0–10.5)

## 2023-05-13 LAB — PSA: PSA: 1.01 ng/mL (ref 0.10–4.00)

## 2023-05-13 LAB — TSH: TSH: 1.46 u[IU]/mL (ref 0.35–5.50)

## 2023-05-13 MED ORDER — LOPERAMIDE HCL 2 MG PO TABS: 2.0000 | ORAL_TABLET | Freq: Three times a day (TID) | ORAL | 2 refills | Status: DC

## 2023-05-13 MED ORDER — OMEPRAZOLE 40 MG PO CPDR: 40.0000 mg | DELAYED_RELEASE_CAPSULE | Freq: Every day | ORAL | 3 refills | Status: DC

## 2023-05-13 NOTE — Patient Instructions (Signed)

## 2023-05-13 NOTE — Assessment & Plan Note (Signed)
Ghm utd Check labs  See AVS Health Maintenance  Topic Date Due   COVID-19 Vaccine (7 - 2024-25 season) 03/15/2023   Colonoscopy  12/06/2030   DTaP/Tdap/Td (3 - Td or Tdap) 04/30/2032   INFLUENZA VACCINE  Completed   Hepatitis C Screening  Completed   HIV Screening  Completed   Zoster Vaccines- Shingrix  Completed   HPV VACCINES  Aged Out

## 2023-05-13 NOTE — Progress Notes (Signed)
Established Patient Office Visit  Subjective   Patient ID: Christopher Bell, male    DOB: 09-29-1960  Age: 63 y.o. MRN: [id number]  Chief Complaint  Patient presents with   Annual Exam    Pt states fasting     HPI Discussed the use of AI scribe software for clinical note transcription with the patient, who gave verbal consent to proceed.  History of Present Illness   The patient, with a history of chronic conditions, presents for an annual physical examination. He reports no new health issues or changes in his chronic conditions. He continues to manage his conditions with omeprazole and Imodium, which he refills as needed. The patient mentions occasional joint pain, but has not sought treatment as he considers it a part of aging. He has not noticed any concerning skin changes. The patient's father passed away four years ago due to a heart attack, which resulted in drowning. The patient's mother is currently alive and well. The patient's vaccinations, including the flu and COVID-19, are up-to-date. He has declined the pneumonia vaccine at this time.      Patient Active Problem List   Diagnosis Date Noted   Closed traumatic fracture of ribs of left side with pneumothorax 04/23/2020   Irritable bowel syndrome (IBS) 09/20/2019   Upper respiratory tract infection 04/20/2018   HSV-2 infection 08/24/2016   Numerous moles 11/04/2014   Preventative health care 11/04/2014   Erectile dysfunction 04/24/2013   Motion sickness 04/24/2013   BARRETTS ESOPHAGUS 05/06/2007   History of digestive system disease 01/30/2007   Personal history of other diseases of digestive system 01/30/2007   Past Medical History:  Diagnosis Date   GERD (gastroesophageal reflux disease)    History reviewed. No pertinent surgical history. Social History   Tobacco Use   Smoking status: Never   Smokeless tobacco: Never  Substance Use Topics   Alcohol use: No   Drug use: No   Social History   Socioeconomic  History   Marital status: Married    Spouse name: Not on file   Number of children: Not on file   Years of education: Not on file   Highest education level: Bachelor's degree (e.g., BA, AB, BS)  Occupational History   Occupation: Transport planner  Tobacco Use   Smoking status: Never   Smokeless tobacco: Never  Substance and Sexual Activity   Alcohol use: No   Drug use: No   Sexual activity: Yes    Partners: Female  Other Topics Concern   Not on file  Social History Narrative   Exercise-no-walks a lot at work      Social Drivers of Corporate investment banker Strain: Low Risk  (03/09/2023)   Overall Financial Resource Strain (CARDIA)    Difficulty of Paying Living Expenses: Not hard at all  Food Insecurity: No Food Insecurity (03/09/2023)   Hunger Vital Sign    Worried About Running Out of Food in the Last Year: Never true    Ran Out of Food in the Last Year: Never true  Transportation Needs: No Transportation Needs (03/09/2023)   PRAPARE - Administrator, Civil Service (Medical): No    Lack of Transportation (Non-Medical): No  Physical Activity: Insufficiently Active (03/09/2023)   Exercise Vital Sign    Days of Exercise per Week: 2 days    Minutes of Exercise per Session: 40 min  Stress: No Stress Concern Present (03/09/2023)   [employer] of Occupational Health - Occupational Stress  Questionnaire    Feeling of Stress : Only a little  Social Connections: Socially Integrated (03/09/2023)   Social Connection and Isolation Panel [NHANES]    Frequency of Communication with Friends and Family: More than three times a week    Frequency of Social Gatherings with Friends and Family: More than three times a week    Attends Religious Services: More than 4 times per year    Active Member of [employer] or Organizations: Yes    Attends Engineer, structural: More than 4 times per year    Marital Status: Married  Catering manager Violence: Not on file   Family  Status  Relation Name Status   Mother  Alive   Father  Deceased at age 17       pt had heart attack in pool and drowned  No partnership data on file   Family History  Problem Relation Age of Onset   Heart attack Father    No Known Allergies    Review of Systems  Constitutional:  Negative for fever and malaise/fatigue.  HENT:  Negative for congestion.   Eyes:  Negative for blurred vision.  Respiratory:  Negative for cough and shortness of breath.   Cardiovascular:  Negative for chest pain, palpitations and leg swelling.  Gastrointestinal:  Negative for abdominal pain, blood in stool, nausea and vomiting.  Genitourinary:  Negative for dysuria and frequency.  Musculoskeletal:  Negative for back pain and falls.  Skin:  Negative for rash.  Neurological:  Negative for dizziness, loss of consciousness and headaches.  Endo/Heme/Allergies:  Negative for environmental allergies.  Psychiatric/Behavioral:  Negative for depression. The patient is not nervous/anxious.       Objective:     BP 108/80 (BP Location: Left Arm, Patient Position: Sitting, Cuff Size: Normal)   Pulse 80   Temp 97.8 F (36.6 C) (Oral)   Resp 18   Ht 5\' 10"  (1.778 m)   Wt 188 lb (85.3 kg)   SpO2 98%   BMI 26.98 kg/m  BP Readings from Last 3 Encounters:  05/13/23 108/80  03/09/23 119/80  04/30/22 110/68   Wt Readings from Last 3 Encounters:  05/13/23 188 lb (85.3 kg)  03/09/23 187 lb 6.4 oz (85 kg)  04/30/22 184 lb (83.5 kg)   SpO2 Readings from Last 3 Encounters:  05/13/23 98%  03/09/23 96%  04/30/22 96%      Physical Exam Vitals and nursing note reviewed.  Constitutional:      General: He is not in acute distress.    Appearance: Normal appearance. He is well-developed.  HENT:     Head: Normocephalic and atraumatic.     Right Ear: Tympanic membrane, ear canal and external ear normal. There is no impacted cerumen.     Left Ear: Tympanic membrane, ear canal and external ear normal. There is  no impacted cerumen.     Nose: Nose normal.     Mouth/Throat:     Mouth: Mucous membranes are moist.     Pharynx: Oropharynx is clear. No oropharyngeal exudate or posterior oropharyngeal erythema.  Eyes:     General: No scleral icterus.       Right eye: No discharge.        Left eye: No discharge.     Conjunctiva/sclera: Conjunctivae normal.     Pupils: Pupils are equal, round, and reactive to light.  Neck:     Thyroid: No thyromegaly.     Vascular: No JVD.  Cardiovascular:  Rate and Rhythm: Normal rate and regular rhythm.     Heart sounds: Normal heart sounds. No murmur heard. Pulmonary:     Effort: Pulmonary effort is normal. No respiratory distress.     Breath sounds: Normal breath sounds.  Abdominal:     General: Bowel sounds are normal. There is no distension.     Palpations: Abdomen is soft. There is no mass.     Tenderness: There is no abdominal tenderness. There is no guarding or rebound.  Musculoskeletal:        General: Normal range of motion.     Cervical back: Normal range of motion and neck supple.     Right lower leg: No edema.     Left lower leg: No edema.  Lymphadenopathy:     Cervical: No cervical adenopathy.  Skin:    General: Skin is warm and dry.     Findings: No erythema or rash.  Neurological:     Mental Status: He is alert and oriented to person, place, and time.     Cranial Nerves: No cranial nerve deficit.     Motor: No abnormal muscle tone.     Deep Tendon Reflexes: Reflexes are normal and symmetric. Reflexes normal.  Psychiatric:        Mood and Affect: Mood normal.        Behavior: Behavior normal.        Thought Content: Thought content normal.        Judgment: Judgment normal.      No results found for any visits on 05/13/23.  Last CBC Lab Results  Component Value Date   WBC 8.6 04/30/2022   HGB 13.0 (L) 04/30/2022   HCT 37.4 (L) 04/30/2022   MCV 88.8 04/30/2022   MCH 30.9 04/30/2022   RDW 12.3 04/30/2022   PLT 280 04/30/2022    Last metabolic panel Lab Results  Component Value Date   GLUCOSE 92 04/30/2022   NA 140 04/30/2022   K 4.2 04/30/2022   CL 105 04/30/2022   CO2 23 04/30/2022   BUN 16 04/30/2022   CREATININE 1.03 04/30/2022   GFR 85.66 09/22/2020   CALCIUM 9.2 04/30/2022   PROT 6.9 04/30/2022   ALBUMIN 4.6 09/22/2020   BILITOT 0.3 04/30/2022   ALKPHOS 76 09/22/2020   AST 20 04/30/2022   ALT 15 04/30/2022   ANIONGAP 9 04/26/2020   Last lipids Lab Results  Component Value Date   CHOL 228 (H) 04/30/2022   HDL 56 04/30/2022   LDLCALC 139 (H) 04/30/2022   LDLDIRECT 161.0 09/22/2020   TRIG 193 (H) 04/30/2022   CHOLHDL 4.1 04/30/2022   Last hemoglobin A1c No results found for: "HGBA1C" Last thyroid functions Lab Results  Component Value Date   TSH 1.36 04/30/2022   Last vitamin D No results found for: "25OHVITD2", "25OHVITD3", "VD25OH" Last vitamin B12 and Folate No results found for: "VITAMINB12", "FOLATE"    The 10-year ASCVD risk score (Arnett DK, et al., 2019) is: 7.3%    Assessment & Plan:   Problem List Items Addressed This Visit       Unprioritized   Irritable bowel syndrome (IBS)   Relevant Medications   omeprazole (PRILOSEC) 40 MG capsule   loperamide (IMODIUM A-D) 2 MG tablet   Preventative health care - Primary   Ghm utd Check labs  See AVS Health Maintenance  Topic Date Due   COVID-19 Vaccine (7 - 2024-25 season) 03/15/2023   Colonoscopy  12/06/2030   DTaP/Tdap/Td (3 -  Td or Tdap) 04/30/2032   INFLUENZA VACCINE  Completed   Hepatitis C Screening  Completed   HIV Screening  Completed   Zoster Vaccines- Shingrix  Completed   HPV VACCINES  Aged Out         Relevant Orders   Lipid panel   PSA   TSH   Comprehensive metabolic panel   CBC with Differential/Platelet   Other Visit Diagnoses       Gastroesophageal reflux disease, unspecified whether esophagitis present       Relevant Medications   omeprazole (PRILOSEC) 40 MG capsule   loperamide  (IMODIUM A-D) 2 MG tablet     Assessment and Plan    Chronic Diarrhea Managed with Imodium due to limitations on over-the-counter quantities. Discussed the benefits and necessity of ongoing management. Refill Imodium prescription at [facility].  Gastroesophageal Reflux Disease (GERD) Well-managed with omeprazole. No new symptoms or complications. Emphasized the importance of adhering to the medication regimen. Refill omeprazole prescription with 90 tablets and 3 refills.  General Health Maintenance Up to date on colonoscopy, Tdap, and COVID-19 vaccinations, with the last COVID-19 booster received in October 2024 [facility]). Discussed the new pneumonia vaccine recommendation for individuals 50 and over, but declined at this time.  Follow-up Schedule a follow-up in one year for the annual physical.        Return in about 1 year (around 05/12/2024), or if symptoms worsen or fail to improve, for annual exam, fasting.    Donato Schultz, DO

## 2023-05-19 ENCOUNTER — Encounter: Payer: Self-pay | Admitting: Family Medicine

## 2024-01-09 ENCOUNTER — Encounter: Payer: Self-pay | Admitting: Family Medicine

## 2024-01-09 ENCOUNTER — Other Ambulatory Visit: Payer: Self-pay | Admitting: Family Medicine

## 2024-01-09 DIAGNOSIS — B009 Herpesviral infection, unspecified: Secondary | ICD-10-CM

## 2024-01-09 MED ORDER — VALACYCLOVIR HCL 1 G PO TABS: 1000.0000 mg | ORAL_TABLET | Freq: Three times a day (TID) | ORAL | 2 refills | Status: DC

## 2024-01-09 NOTE — Telephone Encounter (Signed)
Rx not on med list . Please advise

## 2024-01-10 MED ORDER — VALACYCLOVIR HCL 1 G PO TABS: 1000.0000 mg | ORAL_TABLET | Freq: Three times a day (TID) | ORAL | 2 refills | Status: AC

## 2024-04-27 ENCOUNTER — Other Ambulatory Visit: Payer: Self-pay | Admitting: Family Medicine

## 2024-04-27 DIAGNOSIS — K219 Gastro-esophageal reflux disease without esophagitis: Secondary | ICD-10-CM

## 2024-05-16 ENCOUNTER — Other Ambulatory Visit: Payer: Self-pay | Admitting: Family Medicine

## 2024-05-16 DIAGNOSIS — K589 Irritable bowel syndrome without diarrhea: Secondary | ICD-10-CM

## 2024-06-11 ENCOUNTER — Encounter: Admitting: Family Medicine
